# Patient Record
Sex: Female | Born: 1935 | Race: White | Hispanic: No | Smoking: Never smoker
Health system: Southern US, Community
[De-identification: ages and names within clinical notes are randomized; demographics above are authoritative.]

## PROBLEM LIST (undated history)

## (undated) DIAGNOSIS — F0280 Dementia in other diseases classified elsewhere without behavioral disturbance: Secondary | ICD-10-CM

## (undated) DIAGNOSIS — E079 Disorder of thyroid, unspecified: Secondary | ICD-10-CM

## (undated) DIAGNOSIS — G309 Alzheimer's disease, unspecified: Secondary | ICD-10-CM

## (undated) HISTORY — PX: KNEE SURGERY: SHX244

---

## 1998-10-19 ENCOUNTER — Ambulatory Visit (HOSPITAL_COMMUNITY): Admission: RE | Admit: 1998-10-19 | Discharge: 1998-10-19 | Payer: Self-pay | Admitting: Gastroenterology

## 1998-10-19 ENCOUNTER — Encounter: Payer: Self-pay | Admitting: Gastroenterology

## 1998-10-22 ENCOUNTER — Encounter: Payer: Self-pay | Admitting: Gastroenterology

## 1998-10-22 ENCOUNTER — Ambulatory Visit (HOSPITAL_COMMUNITY): Admission: RE | Admit: 1998-10-22 | Discharge: 1998-10-22 | Payer: Self-pay | Admitting: Gastroenterology

## 2001-04-11 ENCOUNTER — Encounter: Payer: Self-pay | Admitting: Emergency Medicine

## 2001-04-11 ENCOUNTER — Emergency Department (HOSPITAL_COMMUNITY): Admission: EM | Admit: 2001-04-11 | Discharge: 2001-04-11 | Payer: Self-pay | Admitting: Emergency Medicine

## 2007-10-13 ENCOUNTER — Emergency Department (HOSPITAL_COMMUNITY): Admission: EM | Admit: 2007-10-13 | Discharge: 2007-10-13 | Payer: Self-pay | Admitting: Emergency Medicine

## 2011-02-25 NOTE — Consult Note (Signed)
NAMEALEYDA, GINDLESPERGER                  ACCOUNT NO.:  [id number]   MEDICAL RECORD NO.:  [id number]          PATIENT TYPE:  EMS   LOCATION:  ED                           FACILITY:  [facility]   PHYSICIAN:  Lorne Skeens. Hoxworth, M.D.DATE OF BIRTH:  1917/03/15   DATE OF CONSULTATION:  10/13/2007  DATE OF DISCHARGE:  10/13/2007                                 CONSULTATION   CHIEF COMPLAINT:  Rectal discomfort and bleeding.   HISTORY OF PRESENT ILLNESS:  Ms. Meda is an 77 year old white female  history of Alzheimer's disease who presents to the [facility] emergency  room.  I was asked by Dr. Deretha Emory to evaluate the patient.  She lives  at home with close assistance from her son and daughter-in-law.  Today  the patient told her son she was having some rectal discomfort.  No  severe pain.  He examined her and found that she had a lot of tissue  protruding from her rectum with some bleeding.  They were seen at local  Urgent [facility] and had a rectal prolapse reduced and were told to come to  the emergency room if it recurred.  Later today her symptoms recurred  and she presented to the emergency room with her family.  She states it  has been like this for couple of days, but she does have dementia and  is unable to give a very accurate history.  Her son who cares for her  quite a bit is not aware of any problems prior to this episode.  She has  not apparently had any abdominal pain, nausea, vomiting, diarrhea or  constipation..  She denies any pain or other complaints currently.  Denies any previous colorectal problems or abdominal surgery.   PAST MEDICAL HISTORY:  Surgery:  Apparently none other than knee  replacement.  Medically she is followed for Alzheimer's, diabetes,  reflux, hypothyroidism and GERD.   MEDICATIONS:  1. Aricept 10 mg a day.  2. Glimepiride 2 mg daily.  3. Synthroid 112 mcg daily.  4. Prevacid 30 mg daily.   ALLERGIES:  None.   SOCIAL HISTORY:  She is widowed, lives alone  with assistance from her  son.  No cigarettes or alcohol.   FAMILY HISTORY:  Noncontributory.   REVIEW OF SYSTEMS:  GENERAL:  No weight change, fever, chills.  RESPIRATORY:  Some occasional shortness of breath, wheezing, minimal.  CARDIAC:  Denies chest pain, palpitations, swelling.  ABDOMEN:  GI  negative as above.  GU:  No urinary difficulties. MUSCULOSKELETAL:  Some  occasional joint pain. NEUROLOGIC:  She has significant confusion at  times.   PHYSICAL EXAMINATION:  VITAL SIGNS:  She is afebrile, vital signs all  within normal limits.  GENERAL:  She is alert, pleasant, but obviously mildly confused white  female.  SKIN:  Warm and dry.  HEENT:  No palpable mass or thyromegaly.  Sclera nonicteric.  LUNGS:  Clear without wheezing or increased work of breathing.  CARDIAC:  Regular rate and rhythm.  No murmurs.  No JVD or edema.  ABDOMEN:  Flat, soft, nontender.  No  palpable masses or organomegaly.  RECTAL:  There is about a 5 cm apparent full-thickness rectal prolapse.  The mucosa is edematous and slightly dusky with some slight bleeding.  This however is easily reduced.  After reduction there is markedly  reduced anal sphincter tone.  No palpable rectal masses.  EXTREMITIES:  No joint swelling deformity.  NEUROLOGIC:  She is oriented to person and somewhat to situation only.  Motor and sensory exams grossly normal.   ASSESSMENT/PLAN:  Rectal prolapse recent onset.  There is some edema  congestion, but no necrosis and is easily reduced.  She will likely need  a procedure for this, but this is non-emergent.  She will need  colorectal referral.  I have  discussed this with the family.  I believe  at this point she can be managed as an outpatient.  She is instructed to  stay off her feet as much as possible and the family is instructed how  to mainly reduce her prolapse should it recur.  I will then make  outpatient referral to a nearby of colorectal practice for evaluation.   Understand that they can call me if this is not manageable or if she is  having increasing symptoms at home.      Lorne Skeens. Hoxworth, M.D.  Electronically Signed     BTH/MEDQ  D:  10/13/2007  T:  10/14/2007  Job:  [id number]

## 2011-11-25 ENCOUNTER — Emergency Department (HOSPITAL_COMMUNITY): Payer: Medicare Other

## 2011-11-25 ENCOUNTER — Encounter (HOSPITAL_COMMUNITY): Payer: Self-pay | Admitting: Emergency Medicine

## 2011-11-25 ENCOUNTER — Emergency Department (HOSPITAL_COMMUNITY)
Admission: EM | Admit: 2011-11-25 | Discharge: 2011-11-25 | Disposition: A | Discharge status: Home / Self Care | Payer: Medicare Other | Attending: Emergency Medicine | Admitting: Emergency Medicine

## 2011-11-25 ENCOUNTER — Other Ambulatory Visit: Payer: Self-pay

## 2011-11-25 DIAGNOSIS — Q438 Other specified congenital malformations of intestine: Secondary | ICD-10-CM | POA: Insufficient documentation

## 2011-11-25 DIAGNOSIS — Y92009 Unspecified place in unspecified non-institutional (private) residence as the place of occurrence of the external cause: Secondary | ICD-10-CM | POA: Insufficient documentation

## 2011-11-25 DIAGNOSIS — W06XXXA Fall from bed, initial encounter: Secondary | ICD-10-CM | POA: Insufficient documentation

## 2011-11-25 DIAGNOSIS — N390 Urinary tract infection, site not specified: Secondary | ICD-10-CM | POA: Insufficient documentation

## 2011-11-25 DIAGNOSIS — E119 Type 2 diabetes mellitus without complications: Secondary | ICD-10-CM | POA: Insufficient documentation

## 2011-11-25 DIAGNOSIS — G309 Alzheimer's disease, unspecified: Secondary | ICD-10-CM | POA: Insufficient documentation

## 2011-11-25 DIAGNOSIS — M79601 Pain in right arm: Secondary | ICD-10-CM

## 2011-11-25 DIAGNOSIS — E079 Disorder of thyroid, unspecified: Secondary | ICD-10-CM | POA: Insufficient documentation

## 2011-11-25 DIAGNOSIS — Z79899 Other long term (current) drug therapy: Secondary | ICD-10-CM | POA: Insufficient documentation

## 2011-11-25 DIAGNOSIS — M79609 Pain in unspecified limb: Secondary | ICD-10-CM | POA: Insufficient documentation

## 2011-11-25 DIAGNOSIS — W19XXXA Unspecified fall, initial encounter: Secondary | ICD-10-CM

## 2011-11-25 DIAGNOSIS — F0280 Dementia in other diseases classified elsewhere without behavioral disturbance: Secondary | ICD-10-CM

## 2011-11-25 HISTORY — DX: Alzheimer's disease, unspecified: G30.9

## 2011-11-25 HISTORY — DX: Dementia in other diseases classified elsewhere, unspecified severity, without behavioral disturbance, psychotic disturbance, mood disturbance, and anxiety: F02.80

## 2011-11-25 HISTORY — DX: Disorder of thyroid, unspecified: E07.9

## 2011-11-25 LAB — URINALYSIS, ROUTINE W REFLEX MICROSCOPIC
Bilirubin Urine: NEGATIVE
Glucose, UA: >1000 mg/dL — AB
Ketones, ur: NEGATIVE mg/dL
Nitrite: POSITIVE — AB
Protein, ur: NEGATIVE mg/dL
Specific Gravity, Urine: 1.03 (ref 1.005–1.030)
Urobilinogen, UA: 0.2 mg/dL (ref 0.0–1.0)
pH: 6 (ref 5.0–8.0)

## 2011-11-25 LAB — URINE MICROSCOPIC-ADD ON

## 2011-11-25 LAB — POCT I-STAT, CHEM 8
HCT: 42 % (ref 36.0–46.0)
Hemoglobin: 14.3 g/dL (ref 12.0–15.0)
Potassium: 4.2 mEq/L (ref 3.5–5.1)
Sodium: 140 mEq/L (ref 135–145)

## 2011-11-25 MED ORDER — ACETAMINOPHEN 325 MG PO TABS
650.0000 mg | ORAL_TABLET | Freq: Once | ORAL | Status: AC
  Administered 2011-11-25: 650 mg via ORAL
  Filled 2011-11-25: qty 2

## 2011-11-25 MED ORDER — SULFAMETHOXAZOLE-TRIMETHOPRIM 400-80 MG PO TABS: 1.0000 | ORAL_TABLET | Freq: Two times a day (BID) | ORAL | Status: AC

## 2011-11-25 MED ORDER — SULFAMETHOXAZOLE-TMP DS 800-160 MG PO TABS
1.0000 | ORAL_TABLET | Freq: Once | ORAL | Status: AC
  Administered 2011-11-25: 1 via ORAL
  Filled 2011-11-25: qty 1

## 2011-11-25 NOTE — Discharge Instructions (Signed)
Alzheimer's Disease Alzheimer's disease is a breaking down of the brain that sometimes happens with aging. It often affects memory, thinking, and speaking. It also affects how you get along with people and job performance. Often the changes come on slowly and are not very noticeable. The changes may be silent and hidden for a long time, even from family and friends. Mood and personality changes often cause the family to seek medical care. CAUSES  Alzheimer's disease is the leading cause of dementia. Dementia is a reduced ability of the working of the brain. Alzheimer's is one type of dementia and is caused by small changes in the brain. There are many causes of Alzheimer's disease. DIAGNOSIS  There are no specific tests for Alzheimer's disease, other than doing a biopsy(tissue sample) of the brain. This is not usually done. Physical examination and history often provide the best clues for a diagnosis. Often the diagnosis may be made over time, with more observation.  TREATMENT  There is no specific treatment for Alzheimer's disease. Your caregivers will discuss the particular problems you are dealing with or may deal with. They can help direct you to other caregivers who can help in the care of Alzheimer's patients.  Document Released: 07/09/2005 Document Revised: 06/11/2011 Document Reviewed: 03/01/2007 [facility] Patient Information 2012 [place], [place].   Home Safety and Preventing Falls Falls are a leading cause of injury and while they affect all age groups, falls have greater short-term and long-term impact on older age groups. However, falls should not be a part of life or aging. It is possible for individuals and their families to use preventive measures to significantly decrease the likelihood that anyone, especially an older adult, will fall. There are many simple measures which can make your home safer with respect to preventing falls. The following actions can help reduce falls among all  members of your family and are especially important as you age, when your balance, lower limb strength, coordination, and eyesight may be declining. The use of preventive measures will help to reduce you and your family's risk of falls and serious medical consequences. OUTDOORS  Repair cracks and edges of walkways and driveways.   Remove high doorway thresholds and trim shrubbery on the main path into your home.   Ensure there is good outside lighting at main entrances and along main walkways.   Clear walkways of tools, rocks, debris, and clutter.   Check that handrails are not broken and are securely fastened. Both sides of steps should have handrails.   In the garage, be attentive to and clean up grease or oil spills on the cement. This can make the surface extremely slippery.   In winter, have leaves, snow, and ice cleared regularly.   Use sand or salt on walkways during winter months.  BATHROOM  Install grab bars by the toilet and in the tub and shower.   Use non-skid mats or decals in the tub or shower.   If unable to easily stand unsupported while showering, place a plastic non slip stool in the shower to sit on when needed.   Install night lights.   Keep floors dry and clean up all water on the floor immediately.   Remove soap buildup in tub or shower on a regular basis.   Secure bath mats with non-slip, double-sided rug tape.   Remove tripping hazards from the floors.  BEDROOMS  Install night lights.   Do not use oversized bedding.   Make sure a bedside light is easy  to reach.   Keep a telephone by your bedside.   Make sure that you can get in and out of your bed easily.   Have a firm chair, with side arms, to use for getting dressed.   Remove clutter from around closets.   Store clothing, bed coverings, and other household items where you can reach them comfortably.   Remove tripping hazards from the floor.  LIVING AREAS AND STAIRWAYS  Turn on lights  to avoid having to walk through dark areas.   Keep lighting uniform in each room. Place brighter lightbulbs in darker areas, including stairways.   Replace lightbulbs that burn out in stairways immediately.   Arrange furniture to provide for clear pathways.   Keep furniture in the same place.   Eliminate or tape down electrical cables in high traffic areas.   Place handrails on both sides of stairways. Use handrails when going up or down stairs.   Most falls occur on the top or bottom 3 steps.   Fix any loose handrails. Make sure handrails on both sides of the stairways are as long as the stairs.   Remove all walkway obstacles.   Coil or tape electrical cords off to the side of walking areas and out of the way. If using many extension cords, have an electrician put in a new wall outlet to reduce or eliminate them.   Make sure spills are cleaned up quickly and allow time for drying before walking on freshly cleaned floors.   Firmly attach carpet with non-skid or two-sided tape.   Keep frequently used items within easy reach.   Remove tripping hazards such as throw rugs and clutter in walkways. Never leave objects on stairs.   Get rid of throw rugs elsewhere if possible.   Eliminate uneven floor surfaces.   Make sure couches and chairs are easy to get into and out of.   Check carpeting to make sure it is firmly attached along stairs.   Make repairs to worn or loose carpet promptly.   Select a carpet pattern that does not visually hide the edge of steps.   Avoid placing throw rugs or scatter rugs at the top or bottom of stairways, or properly secure with carpet tape to prevent slippage.   Have an electrician put in a light switch at the top and bottom of the stairs.   Get light switches that glow.   Avoid the following practices: hurrying, inattention, obscured vision, carrying large loads, and wearing slip-on shoes.   Be aware of all pets.  KITCHEN  Place items that  are used frequently, such as dishes and food, within easy reach.   Keep handles on pots and pans toward the center of the stove. Use back burners when possible.   Make sure spills are cleaned up quickly and allow time for drying.   Avoid walking on wet floors.   Avoid hot utensils and knives.   Position shelves so they are not too high or low.   Place commonly used objects within easy reach.   If necessary, use a sturdy step stool with a grab bar when reaching.   Make sure electrical cables are out of the way.   Do not use floor polish or wax that makes floors slippery.  OTHER HOME FALL PREVENTION STRATEGIES  Wear low heel or rubber sole shoes that are supportive and fit well.   Wear closed toe shoes.   Know and watch for side effects of medications. Have your  caregiver or pharmacist look at all your medicines, even over-the-counter medicines. Some medicines can make you sleepy or dizzy.   Exercise regularly. Exercise makes you stronger and improves your balance and coordination.   Limit use of alcohol.   Use eyeglasses if necessary and keep them clean. Have your vision checked every year.   Organize your household in a manner that minimizes the need to walk distances when hurried, or go up and down stairs unnecessarily. For example, have a phone placed on at least each floor of your home. If possible, have a phone beside each sitting or lying area where you spend the most time at home. Keep emergency numbers posted at all phones.   Use non-skid floor wax.   When using a ladder, make sure:   The base is firm.   All ladder feet are on level ground.   The ladder is angled against the wall properly.   When climbing a ladder, face the ladder and hold the ladder rungs firmly.   If reaching, always keep your hips and body weight centered between the rails.   When using a stepladder, make sure it is fully opened and both spreaders are firmly locked.   Do not climb a closed  stepladder.   Avoid climbing beyond the second step from the top of a stepladder and the 4th rung from the top of an extension ladder.   Learn and use mobility aids as needed.   Change positions slowly. Arise slowly from sitting and lying positions. Sit on the edge of your bed before getting to your feet.   If you have a history of falls, ask someone to add color or contrast paint or tape to grab bars and handrails in your home.   If you have a history of falls, ask someone to place contrasting color strips on first and last steps.   Install an electrical emergency response system if you need one, and know how to use it.   If you have a medical or other condition that causes you to have limited physical strength, it is important that you reach out to family and friends for occasional help.  FOR CHILDREN:  If young children are in the home, use safety gates. At the top of stairs use screw-mounted gates; use pressure-mounted gates for the bottom of the stairs and doorways between rooms.   Young children should be taught to descend stairs on their stomachs, feet first, and later using the handrail.   Keep drawers fully closed to prevent them from being climbed on or pulled out entirely.   Move chairs, cribs, beds and other furniture away from windows.   Consider installing window guards on windows ground floor and up, unless they are emergency fire exits. Make sure they have easy release mechanisms.   Consider installing special locks that only allow the window to be opened to a certain height.   Never rely on window screens to prevent falls.   Never leave babies alone on changing tables, beds or sofas. Use a changing table that has a restraining strap.   When a child can pull to a standing position, the crib mattress should be adjusted to its lowest position. There should be at least 26 inches between the top rails of the crib drop side and the mattress. Toys, bumper pads, and other  objects that can be used as steps to climb out should be removed from the crib.   On bunk beds never allow a child  under age 63 to sleep on the top bunk. For older children, if the upper bunk is not against a wall, use guard rails on both sides. No matter how old a child is, keep the guard rails in place on the top bunk since children roll during sleep. Do not permit horseplay on bunks.   Grass and soil surfaces beneath backyard playground equipment should be replaced with hardwood chips, shredded wood mulch, sand, pea gravel, rubber, crushed stone, or another safer material at depths of at least 9 to 12 inches.   When riding bikes or using skates, skateboards, skis, or snowboards, require children to wear helmets. Look for those that have stickers stating that they meet or exceed safety standards.   Vertical posts or pickets in deck, balcony, and stairway railings should be no more than 3 1/2 inches apart if a young baby will have access to the area. The space between horizontal rails or bars, and between the floor and the first horizontal rail or bar, should be no more than 3 1/2 inches.  Document Released: 09/19/2002 Document Revised: 06/11/2011 Document Reviewed: 07/19/2009 [facility] Patient Information 2012 [place], [place].   Contusion A bruise (contusion) or hematoma is a collection of blood under skin causing an area of discoloration. It is caused by an injury to blood vessels beneath the injured area with a release of blood into that area. As blood accumulates it is known as a hematoma. This collection of blood causes a blue to dark blue color. As the injury improves over days to weeks it turns to a yellowish color and then usually disappears completely over the same period of time. These generally resolve completely without problems. The hematoma rarely requires drainage. HOME CARE INSTRUCTIONS   Apply ice to the injured area for 15 to 20 minutes 3 to 4 times per day for the first 1 or 2  days.   Put the ice in a plastic bag and place a towel between the bag of ice and your skin. Discontinue the ice if it causes pain.   If bleeding is more than just a little, apply pressure to the area for at least thirty minutes to decrease the amount of bruising. Apply pressure and ice as your caregiver suggests.   If the injury is on an extremity, elevation of that part may help to decrease pain and swelling. Wrapping with an ace or supportive wrap may also be helpful. If the bruise is on a lower extremity and is painful, crutches may be helpful for a couple days.   If you have been given a tetanus shot because the skin was broken, your arm may get swollen, red and warm to touch at the shot site. This is a normal response to the medicine in the shot. If you did not receive a tetanus shot today because you did not recall when your last one was given, make sure to check with your caregiver's office and determine if one is needed. Generally for a "dirty" wound, you should receive a tetanus booster if you have not had one in the last five years. If you have a "clean" wound, you should receive a tetanus booster if you have not had one within the last ten years.  SEEK MEDICAL CARE IF:   You have pain not controlled with over the counter medications. Only take over-the-counter or prescription medicines for pain, discomfort, or fever as directed by your caregiver. Do not use aspirin as it may cause bleeding.   You  develop increasing pain or swelling in the area of injury.   You develop any problems which seem worse than the problems which brought you in.  SEEK IMMEDIATE MEDICAL CARE IF:   You have a fever.   You develop severe pain in the area of the bruise out of proportion to the initial injury.   The bruised area becomes red, tender, and swollen.  MAKE SURE YOU:   Understand these instructions.   Will watch your condition.   Will get help right away if you are not doing well or get worse.    Document Released: 07/09/2005 Document Revised: 06/11/2011 Document Reviewed: 05/17/2008 [facility] Patient Information 2012 [place], [place].

## 2011-11-25 NOTE — ED Notes (Signed)
Brought to ED by son with c/o right arm pain. Lives with son, has hx of alzheimer's, was found on floor next to bed this am. No bruising noted, has some mobility in arm, hurts to move at shoulder.

## 2011-11-25 NOTE — ED Notes (Signed)
EKG shown to Dr. Patrica Duel

## 2011-11-25 NOTE — ED Provider Notes (Signed)
History     CSN: [id number]  Arrival date & time 11/25/11  1191   First MD Initiated Contact with Patient 11/25/11 [phone]      Chief Complaint  Patient presents with  . right arm pain    patient with a known history of Alzheimer's dementia, lives at home with son, apparently, fell from bed in the middle of the night and was found in the floor. Next to the bed. She complains of pain in her proximal right arm and humerus area. She denies any pain or injury to the head or neck. The son states that she has been at her baseline. No increasing confusion. No recent illness. No vomiting or nausea. Denies any other symptoms such as chest pain, abdominal pain or back pain.  (Consider location/radiation/quality/duration/timing/severity/associated sxs/prior treatment) HPI  Past Medical History  Diagnosis Date  . Alzheimer's dementia   . Diabetes mellitus   . Thyroid disease     Past Surgical History  Procedure Date  . Knee surgery     No family history on file.  History  Substance Use Topics  . Smoking status: Not on file  . Smokeless tobacco: Not on file  . Alcohol Use: No    OB History    Grav Para Term Preterm Abortions TAB SAB Ect Mult Living                  Review of Systems  Unable to perform ROS   Allergies  Review of patient's allergies indicates no known allergies.  Home Medications   Current Outpatient Rx  Name Route Sig Dispense Refill  . DONEPEZIL HCL 10 MG PO TABS Oral Take 10 mg by mouth daily.    . OMEGA-3 FATTY ACIDS 1000 MG PO CAPS Oral Take 1 g by mouth daily.    Marland Kitchen GINKGO BILOBA 100 MG PO CAPS Oral Take 100 mg by mouth daily.    Marland Kitchen GLIMEPIRIDE 2 MG PO TABS Oral Take 2 mg by mouth daily before breakfast.    . LANSOPRAZOLE 30 MG PO CPDR Oral Take 30 mg by mouth daily.    Marland Kitchen LEVOTHYROXINE SODIUM 112 MCG PO TABS Oral Take 112 mcg by mouth daily.    . ADULT MULTIVITAMIN W/MINERALS CH Oral Take 1 tablet by mouth daily.      BP 148/72  Pulse 110   Temp(Src) 98.5 F (36.9 C) (Oral)  Resp 18  SpO2 100%  Physical Exam  Nursing note and vitals reviewed. Constitutional: She appears well-developed and well-nourished.  HENT:  Head: Normocephalic and atraumatic.  Eyes: Conjunctivae and EOM are normal. Pupils are equal, round, and reactive to light.  Neck: Neck supple.  Cardiovascular: Normal rate and regular rhythm.  Exam reveals no gallop and no friction rub.   No murmur heard. Pulmonary/Chest: Breath sounds normal. She has no wheezes. She has no rales. She exhibits no tenderness.  Abdominal: Soft. Bowel sounds are normal. She exhibits no distension. There is no tenderness. There is no rebound and no guarding.  Musculoskeletal: Normal range of motion. She exhibits tenderness. She exhibits no edema.       Tenderness to the proximal right humerus, no redness or swelling. Distal pulses are normal. No deformity  Neurological: She is alert. No cranial nerve deficit. Coordination normal.       Awake alert, baseline dementia. No focal deficits  Skin: Skin is warm and dry. No rash noted.  Psychiatric: She has a normal mood and affect.    ED Course  Procedures (including critical care time)  Labs Reviewed  URINALYSIS, ROUTINE W REFLEX MICROSCOPIC - Abnormal; Notable for the following:    APPearance CLOUDY (*)    Glucose, UA >1000 (*)    Hgb urine dipstick TRACE (*)    Nitrite POSITIVE (*)    Leukocytes, UA MODERATE (*)    All other components within normal limits  POCT I-STAT, CHEM 8 - Abnormal; Notable for the following:    Glucose, Bld 294 (*)    All other components within normal limits  URINE MICROSCOPIC-ADD ON - Abnormal; Notable for the following:    Squamous Epithelial / LPF FEW (*)    Bacteria, UA MANY (*)    All other components within normal limits   Dg Humerus Right  11/25/2011  *RADIOLOGY REPORT*  Clinical Data: 78 year old female status post fall with right humerus pain.  RIGHT HUMERUS - 2+ VIEW  Comparison: None.   Findings: Visualized right ribs and lung parenchyma are within normal limits.  Degenerative changes at the right acromioclavicular joint.  Visualized right clavicle and scapula grossly intact. Grossly normal alignment at the right elbow.  Bicipital groove incidentally prominent on the first view.  No right humerus fracture identified.  IMPRESSION: No acute fracture or dislocation identified about the right humerus.  Original Report Authenticated By: Harley Hallmark, M.D.     No diagnosis found.    MDM  Pt is seen and examined;  Initial history and physical completed.  Will follow.        Results for orders placed during the hospital encounter of 11/25/11  URINALYSIS, ROUTINE W REFLEX MICROSCOPIC      Component Value Range   Color, Urine YELLOW  YELLOW    APPearance CLOUDY (*) CLEAR    Specific Gravity, Urine 1.030  1.005 - 1.030    pH 6.0  5.0 - 8.0    Glucose, UA >1000 (*) NEGATIVE (mg/dL)   Hgb urine dipstick TRACE (*) NEGATIVE    Bilirubin Urine NEGATIVE  NEGATIVE    Ketones, ur NEGATIVE  NEGATIVE (mg/dL)   Protein, ur NEGATIVE  NEGATIVE (mg/dL)   Urobilinogen, UA 0.2  0.0 - 1.0 (mg/dL)   Nitrite POSITIVE (*) NEGATIVE    Leukocytes, UA MODERATE (*) NEGATIVE   POCT I-STAT, CHEM 8      Component Value Range   Sodium 140  135 - 145 (mEq/L)   Potassium 4.2  3.5 - 5.1 (mEq/L)   Chloride 104  96 - 112 (mEq/L)   BUN 18  6 - 23 (mg/dL)   Creatinine, Ser 1.61  0.50 - 1.10 (mg/dL)   Glucose, Bld 096 (*) 70 - 99 (mg/dL)   Calcium, Ion 0.45  4.09 - 1.32 (mmol/L)   TCO2 24  0 - 100 (mmol/L)   Hemoglobin 14.3  12.0 - 15.0 (g/dL)   HCT 81.1  91.4 - 78.2 (%)  URINE MICROSCOPIC-ADD ON      Component Value Range   Squamous Epithelial / LPF FEW (*) RARE    WBC, UA TOO NUMEROUS TO COUNT  <3 (WBC/hpf)   RBC / HPF 3-6  <3 (RBC/hpf)   Bacteria, UA MANY (*) RARE    Dg Humerus Right  11/25/2011  *RADIOLOGY REPORT*  Clinical Data: 78 year old female status post fall with right humerus  pain.  RIGHT HUMERUS - 2+ VIEW  Comparison: None.  Findings: Visualized right ribs and lung parenchyma are within normal limits.  Degenerative changes at the right acromioclavicular joint.  Visualized right clavicle and scapula  grossly intact. Grossly normal alignment at the right elbow.  Bicipital groove incidentally prominent on the first view.  No right humerus fracture identified.  IMPRESSION: No acute fracture or dislocation identified about the right humerus.  Original Report Authenticated By: Harley Hallmark, M.D.     Date: 11/25/2011  Rate: 78  Rhythm: normal sinus rhythm  QRS Axis: normal  Intervals: normal  ST/T Wave abnormalities: normal  Conduction Disutrbances:none  Narrative Interpretation:   Old EKG Reviewed: none available    Labs, UA discussed  with family. Will be stable for discharge. No acute fractures    Shenoa Hattabaugh A. Patrica Duel, MD 11/25/11 1113

## 2017-03-29 DIAGNOSIS — J181 Lobar pneumonia, unspecified organism: Secondary | ICD-10-CM | POA: Diagnosis not present

## 2017-03-29 DIAGNOSIS — Z789 Other specified health status: Secondary | ICD-10-CM

## 2017-03-29 DIAGNOSIS — N390 Urinary tract infection, site not specified: Secondary | ICD-10-CM | POA: Diagnosis not present

## 2017-03-29 DIAGNOSIS — R410 Disorientation, unspecified: Secondary | ICD-10-CM | POA: Diagnosis not present

## 2017-03-30 DIAGNOSIS — J181 Lobar pneumonia, unspecified organism: Secondary | ICD-10-CM | POA: Diagnosis not present

## 2017-03-30 DIAGNOSIS — N390 Urinary tract infection, site not specified: Secondary | ICD-10-CM | POA: Diagnosis not present

## 2017-03-30 DIAGNOSIS — R410 Disorientation, unspecified: Secondary | ICD-10-CM | POA: Diagnosis not present

## 2017-03-30 DIAGNOSIS — Z789 Other specified health status: Secondary | ICD-10-CM | POA: Diagnosis not present

## 2017-04-16 ENCOUNTER — Emergency Department (HOSPITAL_COMMUNITY): Payer: Medicare Other

## 2017-04-16 ENCOUNTER — Encounter (HOSPITAL_COMMUNITY): Payer: Self-pay | Admitting: Emergency Medicine

## 2017-04-16 ENCOUNTER — Inpatient Hospital Stay (HOSPITAL_COMMUNITY)
Admission: EM | Admit: 2017-04-16 | Discharge: 2017-04-21 | DRG: 481 | Disposition: A | Discharge status: Skilled Nursing Facility | Payer: Medicare Other | Attending: Internal Medicine | Admitting: Internal Medicine

## 2017-04-16 DIAGNOSIS — Z9889 Other specified postprocedural states: Secondary | ICD-10-CM

## 2017-04-16 DIAGNOSIS — S72141A Displaced intertrochanteric fracture of right femur, initial encounter for closed fracture: Principal | ICD-10-CM | POA: Diagnosis present

## 2017-04-16 DIAGNOSIS — E089 Diabetes mellitus due to underlying condition without complications: Secondary | ICD-10-CM

## 2017-04-16 DIAGNOSIS — W19XXXA Unspecified fall, initial encounter: Secondary | ICD-10-CM | POA: Diagnosis present

## 2017-04-16 DIAGNOSIS — G309 Alzheimer's disease, unspecified: Secondary | ICD-10-CM | POA: Diagnosis present

## 2017-04-16 DIAGNOSIS — F0280 Dementia in other diseases classified elsewhere without behavioral disturbance: Secondary | ICD-10-CM | POA: Diagnosis present

## 2017-04-16 DIAGNOSIS — D62 Acute posthemorrhagic anemia: Secondary | ICD-10-CM | POA: Diagnosis not present

## 2017-04-16 DIAGNOSIS — Z8781 Personal history of (healed) traumatic fracture: Secondary | ICD-10-CM

## 2017-04-16 DIAGNOSIS — Z09 Encounter for follow-up examination after completed treatment for conditions other than malignant neoplasm: Secondary | ICD-10-CM

## 2017-04-16 DIAGNOSIS — R451 Restlessness and agitation: Secondary | ICD-10-CM | POA: Diagnosis present

## 2017-04-16 DIAGNOSIS — Z79899 Other long term (current) drug therapy: Secondary | ICD-10-CM

## 2017-04-16 DIAGNOSIS — Y92129 Unspecified place in nursing home as the place of occurrence of the external cause: Secondary | ICD-10-CM

## 2017-04-16 DIAGNOSIS — E119 Type 2 diabetes mellitus without complications: Secondary | ICD-10-CM

## 2017-04-16 DIAGNOSIS — Z66 Do not resuscitate: Secondary | ICD-10-CM | POA: Diagnosis present

## 2017-04-16 DIAGNOSIS — T148XXA Other injury of unspecified body region, initial encounter: Secondary | ICD-10-CM | POA: Diagnosis present

## 2017-04-16 NOTE — ED Triage Notes (Signed)
Per EMS Pt fell on right hip. No bruising or deformity noted.

## 2017-04-17 ENCOUNTER — Inpatient Hospital Stay (HOSPITAL_COMMUNITY): Payer: Medicare Other

## 2017-04-17 ENCOUNTER — Inpatient Hospital Stay (HOSPITAL_COMMUNITY): Payer: Medicare Other | Admitting: Certified Registered Nurse Anesthetist

## 2017-04-17 ENCOUNTER — Encounter (HOSPITAL_COMMUNITY): Payer: Self-pay | Admitting: Family Medicine

## 2017-04-17 ENCOUNTER — Encounter (HOSPITAL_COMMUNITY)
Admission: EM | Disposition: A | Discharge status: Skilled Nursing Facility | Payer: Self-pay | Source: Home / Self Care | Attending: Internal Medicine

## 2017-04-17 DIAGNOSIS — Y92129 Unspecified place in nursing home as the place of occurrence of the external cause: Secondary | ICD-10-CM | POA: Diagnosis not present

## 2017-04-17 DIAGNOSIS — E089 Diabetes mellitus due to underlying condition without complications: Secondary | ICD-10-CM

## 2017-04-17 DIAGNOSIS — Z9889 Other specified postprocedural states: Secondary | ICD-10-CM | POA: Diagnosis not present

## 2017-04-17 DIAGNOSIS — F0280 Dementia in other diseases classified elsewhere without behavioral disturbance: Secondary | ICD-10-CM | POA: Diagnosis present

## 2017-04-17 DIAGNOSIS — Z79899 Other long term (current) drug therapy: Secondary | ICD-10-CM | POA: Diagnosis not present

## 2017-04-17 DIAGNOSIS — R451 Restlessness and agitation: Secondary | ICD-10-CM | POA: Diagnosis present

## 2017-04-17 DIAGNOSIS — G309 Alzheimer's disease, unspecified: Secondary | ICD-10-CM | POA: Diagnosis present

## 2017-04-17 DIAGNOSIS — E119 Type 2 diabetes mellitus without complications: Secondary | ICD-10-CM | POA: Diagnosis present

## 2017-04-17 DIAGNOSIS — Z66 Do not resuscitate: Secondary | ICD-10-CM | POA: Diagnosis present

## 2017-04-17 DIAGNOSIS — D62 Acute posthemorrhagic anemia: Secondary | ICD-10-CM | POA: Diagnosis not present

## 2017-04-17 DIAGNOSIS — T148XXA Other injury of unspecified body region, initial encounter: Secondary | ICD-10-CM | POA: Diagnosis present

## 2017-04-17 DIAGNOSIS — W19XXXA Unspecified fall, initial encounter: Secondary | ICD-10-CM | POA: Diagnosis not present

## 2017-04-17 DIAGNOSIS — S72141A Displaced intertrochanteric fracture of right femur, initial encounter for closed fracture: Secondary | ICD-10-CM | POA: Diagnosis present

## 2017-04-17 HISTORY — PX: INTRAMEDULLARY (IM) NAIL INTERTROCHANTERIC: SHX5875

## 2017-04-17 HISTORY — PX: FEMUR FRACTURE SURGERY: SHX633

## 2017-04-17 LAB — CBC WITH DIFFERENTIAL/PLATELET
BASOS PCT: 0 %
Basophils Absolute: 0 10*3/uL (ref 0.0–0.1)
Eosinophils Absolute: 0.1 10*3/uL (ref 0.0–0.7)
Eosinophils Relative: 1 %
HEMATOCRIT: 26.5 % — AB (ref 36.0–46.0)
HEMOGLOBIN: 8.5 g/dL — AB (ref 12.0–15.0)
LYMPHS PCT: 24 %
Lymphs Abs: 3.1 10*3/uL (ref 0.7–4.0)
MCH: 26 pg (ref 26.0–34.0)
MCHC: 32.1 g/dL (ref 30.0–36.0)
MCV: 81 fL (ref 78.0–100.0)
MONOS PCT: 9 %
Monocytes Absolute: 1.2 10*3/uL — ABNORMAL HIGH (ref 0.1–1.0)
NEUTROS ABS: 8.6 10*3/uL — AB (ref 1.7–7.7)
NEUTROS PCT: 66 %
Platelets: 366 10*3/uL (ref 150–400)
RBC: 3.27 MIL/uL — ABNORMAL LOW (ref 3.87–5.11)
RDW: 16.1 % — ABNORMAL HIGH (ref 11.5–15.5)
WBC: 13 10*3/uL — ABNORMAL HIGH (ref 4.0–10.5)

## 2017-04-17 LAB — COMPREHENSIVE METABOLIC PANEL
ALBUMIN: 3.4 g/dL — AB (ref 3.5–5.0)
ALK PHOS: 148 U/L — AB (ref 38–126)
ALT: 15 U/L (ref 14–54)
ANION GAP: 12 (ref 5–15)
AST: 19 U/L (ref 15–41)
BILIRUBIN TOTAL: 0.6 mg/dL (ref 0.3–1.2)
BUN: 14 mg/dL (ref 6–20)
CALCIUM: 8.8 mg/dL — AB (ref 8.9–10.3)
CO2: 23 mmol/L (ref 22–32)
Chloride: 106 mmol/L (ref 101–111)
Creatinine, Ser: 1.01 mg/dL — ABNORMAL HIGH (ref 0.44–1.00)
GFR calc Af Amer: 52 mL/min — ABNORMAL LOW (ref >60)
GFR calc non Af Amer: 45 mL/min — ABNORMAL LOW (ref >60)
GLUCOSE: 69 mg/dL (ref 65–99)
Potassium: 3.5 mmol/L (ref 3.5–5.1)
Sodium: 141 mmol/L (ref 135–145)
TOTAL PROTEIN: 6.6 g/dL (ref 6.5–8.1)

## 2017-04-17 LAB — PROTIME-INR
INR: 1.08
PROTHROMBIN TIME: 14.1 s (ref 11.4–15.2)

## 2017-04-17 LAB — ABO/RH: ABO/RH(D): A NEG

## 2017-04-17 LAB — GLUCOSE, CAPILLARY
GLUCOSE-CAPILLARY: 89 mg/dL (ref 65–99)
GLUCOSE-CAPILLARY: 147 mg/dL — AB (ref 65–99)
Glucose-Capillary: 106 mg/dL — ABNORMAL HIGH (ref 65–99)
Glucose-Capillary: 76 mg/dL (ref 65–99)
Glucose-Capillary: 128 mg/dL — ABNORMAL HIGH (ref 65–99)

## 2017-04-17 LAB — MRSA PCR SCREENING: MRSA by PCR: NEGATIVE

## 2017-04-17 LAB — SURGICAL PCR SCREEN
MRSA, PCR: NEGATIVE
Staphylococcus aureus: POSITIVE — AB

## 2017-04-17 SURGERY — FIXATION, FRACTURE, INTERTROCHANTERIC, WITH INTRAMEDULLARY ROD
Anesthesia: General | Site: Hip | Laterality: Right

## 2017-04-17 MED ORDER — ONDANSETRON HCL 4 MG/2ML IJ SOLN: 4.0000 mg | Freq: Four times a day (QID) | INTRAMUSCULAR | Status: DC | PRN

## 2017-04-17 MED ORDER — HYDROCORTISONE ACETATE 25 MG RE SUPP
25.0000 mg | Freq: Two times a day (BID) | RECTAL | Status: DC | PRN
  Filled 2017-04-17: qty 1

## 2017-04-17 MED ORDER — METHOCARBAMOL 1000 MG/10ML IJ SOLN
500.0000 mg | Freq: Four times a day (QID) | INTRAVENOUS | Status: DC | PRN
  Filled 2017-04-17: qty 5

## 2017-04-17 MED ORDER — PHENYLEPHRINE HCL 10 MG/ML IJ SOLN
INTRAVENOUS | Status: DC | PRN
  Administered 2017-04-17: 15 ug/min via INTRAVENOUS

## 2017-04-17 MED ORDER — FENTANYL CITRATE (PF) 100 MCG/2ML IJ SOLN
50.0000 ug | Freq: Once | INTRAMUSCULAR | Status: AC
  Administered 2017-04-17: 50 ug via INTRAVENOUS
  Filled 2017-04-17: qty 2

## 2017-04-17 MED ORDER — ASPIRIN EC 81 MG PO TBEC: 81.0000 mg | DELAYED_RELEASE_TABLET | Freq: Two times a day (BID) | ORAL | 0 refills | Status: AC

## 2017-04-17 MED ORDER — ACETAMINOPHEN 325 MG PO TABS
650.0000 mg | ORAL_TABLET | Freq: Four times a day (QID) | ORAL | Status: DC | PRN
  Administered 2017-04-18: 650 mg via ORAL
  Filled 2017-04-17: qty 2

## 2017-04-17 MED ORDER — 0.9 % SODIUM CHLORIDE (POUR BTL) OPTIME
TOPICAL | Status: DC | PRN
  Administered 2017-04-17: 1000 mL

## 2017-04-17 MED ORDER — DOCUSATE SODIUM 100 MG PO CAPS
100.0000 mg | ORAL_CAPSULE | Freq: Two times a day (BID) | ORAL | Status: DC
  Administered 2017-04-18 – 2017-04-21 (×6): 100 mg via ORAL
  Filled 2017-04-17 (×7): qty 1

## 2017-04-17 MED ORDER — FENTANYL CITRATE (PF) 250 MCG/5ML IJ SOLN
INTRAMUSCULAR | Status: AC
Start: 2017-04-17 — End: 2017-04-17
  Filled 2017-04-17: qty 5

## 2017-04-17 MED ORDER — LACTATED RINGERS IV SOLN
INTRAVENOUS | Status: DC
  Administered 2017-04-17: 17:00:00 via INTRAVENOUS

## 2017-04-17 MED ORDER — DEXAMETHASONE SODIUM PHOSPHATE 10 MG/ML IJ SOLN
INTRAMUSCULAR | Status: DC | PRN
  Administered 2017-04-17: 10 mg via INTRAVENOUS

## 2017-04-17 MED ORDER — MIRTAZAPINE 15 MG PO TABS
15.0000 mg | ORAL_TABLET | Freq: Every day | ORAL | Status: DC
  Administered 2017-04-18 – 2017-04-20 (×3): 15 mg via ORAL
  Filled 2017-04-17 (×4): qty 1

## 2017-04-17 MED ORDER — SODIUM CHLORIDE 0.9 % IV SOLN
INTRAVENOUS | Status: DC
  Administered 2017-04-17 – 2017-04-20 (×5): via INTRAVENOUS

## 2017-04-17 MED ORDER — EPHEDRINE SULFATE 50 MG/ML IJ SOLN
INTRAMUSCULAR | Status: DC | PRN
  Administered 2017-04-17 (×2): 10 mg via INTRAVENOUS

## 2017-04-17 MED ORDER — INSULIN ASPART 100 UNIT/ML ~~LOC~~ SOLN
0.0000 [IU] | SUBCUTANEOUS | Status: DC
  Administered 2017-04-18: 3 [IU] via SUBCUTANEOUS
  Administered 2017-04-18: 2 [IU] via SUBCUTANEOUS
  Administered 2017-04-18: 1 [IU] via SUBCUTANEOUS
  Administered 2017-04-18: 2 [IU] via SUBCUTANEOUS
  Administered 2017-04-19: 1 [IU] via SUBCUTANEOUS
  Administered 2017-04-19 – 2017-04-20 (×4): 2 [IU] via SUBCUTANEOUS
  Administered 2017-04-20: 3 [IU] via SUBCUTANEOUS
  Administered 2017-04-20 – 2017-04-21 (×3): 1 [IU] via SUBCUTANEOUS

## 2017-04-17 MED ORDER — ONDANSETRON HCL 4 MG/2ML IJ SOLN
INTRAMUSCULAR | Status: DC | PRN
  Administered 2017-04-17: 4 mg via INTRAVENOUS

## 2017-04-17 MED ORDER — CEFAZOLIN SODIUM-DEXTROSE 2-4 GM/100ML-% IV SOLN
2.0000 g | INTRAVENOUS | Status: AC
  Administered 2017-04-17: 2 g via INTRAVENOUS
  Filled 2017-04-17 (×2): qty 100

## 2017-04-17 MED ORDER — HYDROCODONE-ACETAMINOPHEN 5-325 MG PO TABS
1.0000 | ORAL_TABLET | Freq: Four times a day (QID) | ORAL | Status: DC | PRN
  Administered 2017-04-17: 1 via ORAL
  Administered 2017-04-18: 2 via ORAL
  Administered 2017-04-19 – 2017-04-20 (×5): 1 via ORAL
  Administered 2017-04-21: 2 via ORAL
  Filled 2017-04-17: qty 1
  Filled 2017-04-17: qty 2
  Filled 2017-04-17: qty 1
  Filled 2017-04-17: qty 2
  Filled 2017-04-17: qty 1
  Filled 2017-04-17: qty 2
  Filled 2017-04-17 (×3): qty 1

## 2017-04-17 MED ORDER — FENTANYL CITRATE (PF) 100 MCG/2ML IJ SOLN
25.0000 ug | INTRAMUSCULAR | Status: DC | PRN
  Administered 2017-04-17: 25 ug via INTRAVENOUS

## 2017-04-17 MED ORDER — PROPOFOL 10 MG/ML IV BOLUS
INTRAVENOUS | Status: AC
  Filled 2017-04-17: qty 20

## 2017-04-17 MED ORDER — CHLORHEXIDINE GLUCONATE 4 % EX LIQD
60.0000 mL | Freq: Once | CUTANEOUS | Status: AC
  Administered 2017-04-17: 4 via TOPICAL

## 2017-04-17 MED ORDER — ONDANSETRON HCL 4 MG PO TABS: 4.0000 mg | ORAL_TABLET | Freq: Four times a day (QID) | ORAL | Status: DC | PRN

## 2017-04-17 MED ORDER — CEFAZOLIN SODIUM-DEXTROSE 1-4 GM/50ML-% IV SOLN
1.0000 g | Freq: Two times a day (BID) | INTRAVENOUS | Status: AC
  Administered 2017-04-18 (×2): 1 g via INTRAVENOUS
  Filled 2017-04-17 (×2): qty 50

## 2017-04-17 MED ORDER — FENTANYL CITRATE (PF) 100 MCG/2ML IJ SOLN
25.0000 ug | Freq: Once | INTRAMUSCULAR | Status: AC
  Administered 2017-04-17: 25 ug via INTRAVENOUS
  Filled 2017-04-17: qty 2

## 2017-04-17 MED ORDER — CYCLOBENZAPRINE HCL 10 MG PO TABS
10.0000 mg | ORAL_TABLET | Freq: Once | ORAL | Status: AC
  Administered 2017-04-17: 10 mg via ORAL
  Filled 2017-04-17: qty 1

## 2017-04-17 MED ORDER — FENTANYL CITRATE (PF) 100 MCG/2ML IJ SOLN
INTRAMUSCULAR | Status: DC | PRN
  Administered 2017-04-17: 50 ug via INTRAVENOUS

## 2017-04-17 MED ORDER — OXYCODONE HCL 5 MG PO TABS: 5.0000 mg | ORAL_TABLET | Freq: Once | ORAL | Status: DC | PRN

## 2017-04-17 MED ORDER — ROCURONIUM BROMIDE 50 MG/5ML IV SOLN
INTRAVENOUS | Status: AC
  Filled 2017-04-17: qty 4

## 2017-04-17 MED ORDER — METHOCARBAMOL 500 MG PO TABS
500.0000 mg | ORAL_TABLET | Freq: Four times a day (QID) | ORAL | Status: DC | PRN
Start: 2017-04-17 — End: 2017-04-21
  Administered 2017-04-21: 500 mg via ORAL
  Filled 2017-04-17: qty 1

## 2017-04-17 MED ORDER — PROPOFOL 10 MG/ML IV BOLUS
INTRAVENOUS | Status: DC | PRN
  Administered 2017-04-17: 100 mg via INTRAVENOUS

## 2017-04-17 MED ORDER — OXYCODONE HCL 5 MG/5ML PO SOLN: 5.0000 mg | Freq: Once | ORAL | Status: DC | PRN

## 2017-04-17 MED ORDER — PE-SHARK LIVER OIL-COCOA BUTTR 0.25-3-85.5 % RE SUPP: 1.0000 | Freq: Four times a day (QID) | RECTAL | Status: DC | PRN

## 2017-04-17 MED ORDER — FENTANYL CITRATE (PF) 100 MCG/2ML IJ SOLN
INTRAMUSCULAR | Status: AC
  Filled 2017-04-17: qty 2

## 2017-04-17 MED ORDER — LORAZEPAM 2 MG/ML IJ SOLN
0.5000 mg | Freq: Once | INTRAMUSCULAR | Status: AC
  Administered 2017-04-17: 0.5 mg via INTRAVENOUS
  Filled 2017-04-17: qty 1

## 2017-04-17 MED ORDER — MORPHINE SULFATE (PF) 4 MG/ML IV SOLN
0.5000 mg | INTRAVENOUS | Status: DC | PRN
  Administered 2017-04-17: 0.52 mg via INTRAVENOUS
  Filled 2017-04-17: qty 1

## 2017-04-17 MED ORDER — LIDOCAINE HCL (CARDIAC) 20 MG/ML IV SOLN
INTRAVENOUS | Status: DC | PRN
  Administered 2017-04-17: 100 mg via INTRAVENOUS

## 2017-04-17 MED ORDER — POVIDONE-IODINE 10 % EX SWAB
2.0000 "application " | Freq: Once | CUTANEOUS | Status: AC
  Administered 2017-04-17: 2 via TOPICAL

## 2017-04-17 MED ORDER — ACETAMINOPHEN 650 MG RE SUPP
650.0000 mg | Freq: Four times a day (QID) | RECTAL | Status: DC | PRN
Start: 2017-04-17 — End: 2017-04-21

## 2017-04-17 MED ORDER — PHENYLEPHRINE HCL 10 MG/ML IJ SOLN
INTRAMUSCULAR | Status: DC | PRN
  Administered 2017-04-17 (×2): 80 ug via INTRAVENOUS

## 2017-04-17 SURGICAL SUPPLY — 44 items
BIT DRILL FLUTED FEMUR 4.2/3 (BIT) ×3 IMPLANT
BLADE SURG 15 STRL LF DISP TIS (BLADE) ×1 IMPLANT
BLADE SURG 15 STRL SS (BLADE) ×3
BNDG COHESIVE 4X5 TAN NS LF (GAUZE/BANDAGES/DRESSINGS) ×3 IMPLANT
BNDG COHESIVE 6X5 TAN STRL LF (GAUZE/BANDAGES/DRESSINGS) IMPLANT
BNDG GAUZE ELAST 4 BULKY (GAUZE/BANDAGES/DRESSINGS) ×3 IMPLANT
COVER PERINEAL POST (MISCELLANEOUS) ×3 IMPLANT
COVER SURGICAL LIGHT HANDLE (MISCELLANEOUS) ×3 IMPLANT
DRAPE HALF SHEET 40X57 (DRAPES) IMPLANT
DRAPE STERI IOBAN 125X83 (DRAPES) ×3 IMPLANT
DRSG MEPILEX BORDER 4X4 (GAUZE/BANDAGES/DRESSINGS) ×9 IMPLANT
DRSG MEPILEX BORDER 4X8 (GAUZE/BANDAGES/DRESSINGS) ×3 IMPLANT
DRSG PAD ABDOMINAL 8X10 ST (GAUZE/BANDAGES/DRESSINGS) ×6 IMPLANT
DURAPREP 26ML APPLICATOR (WOUND CARE) ×3 IMPLANT
ELECT CAUTERY BLADE 6.4 (BLADE) ×3 IMPLANT
ELECT REM PT RETURN 9FT ADLT (ELECTROSURGICAL) ×3
ELECTRODE REM PT RTRN 9FT ADLT (ELECTROSURGICAL) ×1 IMPLANT
FACESHIELD WRAPAROUND (MASK) ×3 IMPLANT
GAUZE XEROFORM 5X9 LF (GAUZE/BANDAGES/DRESSINGS) ×3 IMPLANT
GLOVE BIO SURGEON STRL SZ7.5 (GLOVE) ×3 IMPLANT
GLOVE BIOGEL PI IND STRL 8 (GLOVE) ×1 IMPLANT
GLOVE BIOGEL PI INDICATOR 8 (GLOVE) ×2
GOWN STRL REUS W/ TWL LRG LVL3 (GOWN DISPOSABLE) ×2 IMPLANT
GOWN STRL REUS W/ TWL XL LVL3 (GOWN DISPOSABLE) ×1 IMPLANT
GOWN STRL REUS W/TWL LRG LVL3 (GOWN DISPOSABLE) ×6
GOWN STRL REUS W/TWL XL LVL3 (GOWN DISPOSABLE) ×3
GUIDEWIRE 3.2X400 (WIRE) ×3 IMPLANT
KIT BASIN OR (CUSTOM PROCEDURE TRAY) ×3 IMPLANT
KIT ROOM TURNOVER OR (KITS) ×3 IMPLANT
LINER BOOT UNIVERSAL DISP (MISCELLANEOUS) ×3 IMPLANT
MANIFOLD NEPTUNE II (INSTRUMENTS) ×3 IMPLANT
NAIL TROCH FIX 10X235 RT 130 (Nail) ×3 IMPLANT
NS IRRIG 1000ML POUR BTL (IV SOLUTION) ×3 IMPLANT
PACK GENERAL/GYN (CUSTOM PROCEDURE TRAY) ×3 IMPLANT
PAD ARMBOARD 7.5X6 YLW CONV (MISCELLANEOUS) ×6 IMPLANT
PAD CAST 4YDX4 CTTN HI CHSV (CAST SUPPLIES) ×2 IMPLANT
PADDING CAST COTTON 4X4 STRL (CAST SUPPLIES) ×6
SCREW LOCKING 5.0X34MM (Screw) ×3 IMPLANT
SCREW TFNA 90MM HIP (Orthopedic Implant) ×3 IMPLANT
STAPLER VISISTAT 35W (STAPLE) ×3 IMPLANT
SUT MON AB 2-0 CT1 36 (SUTURE) ×3 IMPLANT
TOWEL OR 17X24 6PK STRL BLUE (TOWEL DISPOSABLE) ×3 IMPLANT
TOWEL OR 17X26 10 PK STRL BLUE (TOWEL DISPOSABLE) ×3 IMPLANT
WATER STERILE IRR 1000ML POUR (IV SOLUTION) ×3 IMPLANT

## 2017-04-17 NOTE — Progress Notes (Signed)
Received patient from PACU, assessment completed, VS documented, patient asleep and appears in no distress at this time.  Will continue to monitor.

## 2017-04-17 NOTE — Progress Notes (Signed)
Patient seen and examined at bedside, admitted after midnight, please see earlier admission note by Dr. Onalee Huaavid. Pt admitted after an episode of fall and was found to have sustained right femur fracture. Still in pain this AM, fmaily at bedside, awaiting for ortho input, ? conservative vs surgical management. Pt is DNR and family prefers conservative approach if possible.   Debbora PrestoMAGICK-Teneshia Hedeen, MD  [facility] Pager [phone]  If 7PM-7AM, please contact night-coverage [email or web address] Password [email or web address]

## 2017-04-17 NOTE — Progress Notes (Signed)
Orthopedic Tech Progress Note Patient Details:  Emily KiltsMary Katherine Werner 08/01/1917 [id number]  Musculoskeletal Traction Type of Traction: Bucks Skin Traction Traction Location: rle Traction Weight: 5 lbs    Trinna PostMartinez, [name] 04/17/2017, 6:38 AM

## 2017-04-17 NOTE — Anesthesia Preprocedure Evaluation (Addendum)
Anesthesia Evaluation  Patient identified by MRN, date of birth, ID band Patient awake    Reviewed: Allergy & Precautions, H&P , NPO status , Patient's Chart, lab work & pertinent test results  Airway Mallampati: II   Neck ROM: full    Dental   Pulmonary neg pulmonary ROS,    breath sounds clear to auscultation       Cardiovascular negative cardio ROS   Rhythm:regular Rate:Normal     Neuro/Psych dementia   GI/Hepatic   Endo/Other  diabetes, Type 2  Renal/GU      Musculoskeletal   Abdominal   Peds  Hematology  (+) Blood dyscrasia, anemia ,   Anesthesia Other Findings   Reproductive/Obstetrics                                                             Anesthesia Evaluation  Patient identified by MRN, date of birth, ID band Patient awake    Reviewed: Allergy & Precautions, H&P , NPO status , Patient's Chart, lab work & pertinent test results  Airway Mallampati: II   Neck ROM: full    Dental   Pulmonary neg pulmonary ROS,    breath sounds clear to auscultation       Cardiovascular negative cardio ROS   Rhythm:regular Rate:Normal     Neuro/Psych dementia   GI/Hepatic   Endo/Other  diabetes, Type 2  Renal/GU      Musculoskeletal   Abdominal   Peds  Hematology  (+) Blood dyscrasia, anemia ,   Anesthesia Other Findings   Reproductive/Obstetrics                             Anesthesia Physical Anesthesia Plan  ASA: III  Anesthesia Plan: General   Post-op Pain Management:    Induction:   PONV Risk Score and Plan: 4 or greater and Ondansetron, Dexamethasone, Propofol and Treatment may vary due to age or medical condition  Airway Management Planned: Oral ETT  Additional Equipment:   Intra-op Plan:   Post-operative Plan: Extubation in OR  Informed Consent: I have reviewed the patients History and Physical, chart, labs  and discussed the procedure including the risks, benefits and alternatives for the proposed anesthesia with the patient or authorized representative who has indicated his/her understanding and acceptance.     Plan Discussed with: CRNA, Anesthesiologist and Surgeon  Anesthesia Plan Comments:         Anesthesia Quick Evaluation  Anesthesia Physical Anesthesia Plan  ASA: III  Anesthesia Plan: General   Post-op Pain Management:    Induction:   PONV Risk Score and Plan: 4 or greater and Ondansetron, Dexamethasone, Propofol and Treatment may vary due to age or medical condition  Airway Management Planned: Oral ETT  Additional Equipment:   Intra-op Plan:   Post-operative Plan: Extubation in OR  Informed Consent: I have reviewed the patients History and Physical, chart, labs and discussed the procedure including the risks, benefits and alternatives for the proposed anesthesia with the patient or authorized representative who has indicated his/her understanding and acceptance.     Plan Discussed with: CRNA, Anesthesiologist and Surgeon  Anesthesia Plan Comments:         Anesthesia Quick Evaluation

## 2017-04-17 NOTE — ED Notes (Signed)
Attempted to reposition pt. Pt refused and stated that she was more comfortable laying on the affected extremity. Pt continued to scream out in pain, offered pt ice pack and pt refused. RN Georgann HousekeeperMichael Doss notified

## 2017-04-17 NOTE — Op Note (Signed)
Date of Surgery: 04/17/2017  INDICATIONS: Ms. Emily Werner is a 83 y.o.-year-old female who sustained a right hip fracture. She has very advanced dementia and sustained a fall at her skilled nursing facility. She was found to have a right intertrochanteric hip fracture up at [facility]. Due to her dementia I discussed her injury and recommendation for intramedullary nail fixation with her son and daughter who are her health care power of attorney. The risks and benefits of the procedure discussed, including but not limited to bleeding, infection, damage to neurovascular structures, malunion, nonunion, failure of hardware, painful hardware, leg length inequality, screw cut out, with the family prior to the procedure and all questions were answered; consent was obtained.  PREOPERATIVE DIAGNOSIS: right hip fracture   POSTOPERATIVE DIAGNOSIS: Same   PROCEDURE: Treatment of intertrochanteric, pertrochanteric, subtrochanteric fracture with intramedullary implant. CPT [id number]   SURGEON: Kathi DerJason P. Aundria Rudogers, M.D.   ANESTHESIA: general   IV FLUIDS AND URINE: See anesthesia record   ESTIMATED BLOOD LOSS: 150 mL  IMPLANTS:  Synthes TFN nail   10 mm diameter by European length 90 mm compression screw 5.0 x 34 mm interlock screw  DRAINS: None.   COMPLICATIONS: None.   DESCRIPTION OF PROCEDURE: The patient was brought to the operating room and placed supine on the operating table. The patient's leg had been signed prior to the procedure. The patient had the anesthesia placed by the anesthesiologist. The prep verification and incision time-outs were performed to confirm that this was the correct patient, site, side and location. The patient had an SCD on the opposite lower extremity. The patient did receive antibiotics prior to the incision and was re-dosed during the procedure as needed at indicated intervals. The patient was positioned on the fracture table with the table in traction and internal  rotation to reduce the hip. The well leg was placed in a scissor position and all bony prominences were well-padded. The patient had the lower extremity prepped and draped in the standard surgical fashion. The incision was made 4 finger breadths superior to the greater trochanter. A guide pin was inserted into the tip of the greater trochanter under fluoroscopic guidance. An opening reamer was used to gain access to the femoral canal. The nail length was measured and inserted down the femoral canal to its proper depth. The appropriate version of insertion for the lag screw was found under fluoroscopy. A pin was inserted up the femoral neck through the jig. The length of the lag screw was then measured. The lag screw was inserted as near to center-center in the head as possible. The leg was taken out of traction, then the interdigitating compression screw was used to compress across the fracture. Compression was visualized on serial xrays. we next in our attention to the distal interlocking screw. Utilizing the interlock screw slot on the nail insertion handle we made a percutaneous incision lateral thigh. The drill guide was inserted through the hand rig and we drilled across bilateral cortices through the nail. This was measured off the drill guide in the appropriate screw was placed under hand power. X-rays confirm the proper length of the screw.    The wound was copiously irrigated with saline and the subcutaneous layer closed with 2.0 vicryl and the skin was reapproximated with staples. The wounds were cleaned and dried a final time and a sterile dressing was placed. The hip was taken through a range of motion at the end of the case under fluoroscopic imaging to  visualize the approach-withdraw phenomenon and confirm implant length in the head. The patient was then awakened from anesthesia and taken to the recovery room in stable condition. All counts were correct at the end of the case.   POSTOPERATIVE PLAN:  The patient will be weight bearing as tolerated and will return in 2 weeks for staple removal and the patient will receive DVT prophylaxis based on other medications, activity level, and risk ratio of bleeding to thrombosis.   Emily Rued, MD [facility] [phone] 6:18 PM

## 2017-04-17 NOTE — Anesthesia Procedure Notes (Signed)
Procedure Name: Intubation Date/Time: 04/17/2017 5:28 PM Performed by: Oletta Lamas Pre-anesthesia Checklist: Patient identified, Emergency Drugs available, Suction available and Patient being monitored Patient Re-evaluated:Patient Re-evaluated prior to inductionOxygen Delivery Method: Circle System Utilized Preoxygenation: Pre-oxygenation with 100% oxygen Intubation Type: IV induction Ventilation: Mask ventilation without difficulty Laryngoscope Size: Mac and 3 Grade View: Grade I Tube type: Oral Tube size: 7.0 mm Number of attempts: 1 Airway Equipment and Method: Stylet Placement Confirmation: ETT inserted through vocal cords under direct vision,  positive ETCO2 and breath sounds checked- equal and bilateral Secured at: 21 cm Tube secured with: Tape Dental Injury: Teeth and Oropharynx as per pre-operative assessment

## 2017-04-17 NOTE — Transfer of Care (Signed)
Immediate Anesthesia Transfer of Care Note  Patient: Emily Werner  Procedure(s) Performed: Procedure(s): INTRAMEDULLARY (IM) NAIL INTERTROCHANTRIC (Right)  Patient Location: PACU  Anesthesia Type:General  Level of Consciousness: drowsy, patient cooperative and responds to stimulation Resting with eyes closed.    Airway & Oxygen Therapy: Patient Spontanous Breathing and Patient connected to nasal cannula oxygen RR even and unlabored  Post-op Assessment: Report given to RN and Post -op Vital signs reviewed and stable  Post vital signs: Reviewed and stable  Last Vitals:  Vitals:   04/17/17 0555 04/17/17 1836  BP: (!) 119/54   Pulse: 70   Resp: 18   Temp: 36.9 C (!) (P) 36.3 C    Last Pain:  Vitals:   04/17/17 1140  TempSrc:   PainSc: Asleep         Complications: No apparent anesthesia complications

## 2017-04-17 NOTE — H&P (Signed)
History and Physical    Emily Werner JXB:[id number] DOB: [birth year] DOA: 04/16/2017  PCP: Patient, No Pcp Per  Patient coming from: snf  Chief Complaint:  Found down  HPI: Emily Werner is a 83 y.o. female with medical history significant of DM, dementia comes in after found down at [facility] complaining of pain.  Unknown if LOC but there was no evident head injury and no blood at scene.  Pt had been in her normal state of health.  She cannot provide any history.  Found to have femur fx, and [facility].    Review of Systems: unobtainable due to dementia  Past Medical History:  Diagnosis Date  . Alzheimer's dementia   . Diabetes mellitus   . Thyroid disease     Past Surgical History:  Procedure Laterality Date  . KNEE SURGERY       reports that she has never smoked. She has never used smokeless tobacco. She reports that she does not drink alcohol. Her drug history is not on file.  No Known Allergies  No family history on file. unknown  Prior to Admission medications   Medication Sig Start Date End Date Taking? Authorizing Provider  donepezil (ARICEPT) 10 MG tablet Take 10 mg by mouth daily.    [provider]  fish oil-omega-3 fatty acids 1000 MG capsule Take 1 g by mouth daily.    [provider]  Ginkgo Biloba 100 MG CAPS Take 100 mg by mouth daily.    [provider]  glimepiride (AMARYL) 2 MG tablet Take 2 mg by mouth daily before breakfast.    [provider]  lansoprazole (PREVACID) 30 MG capsule Take 30 mg by mouth daily.    [provider]  levothyroxine (SYNTHROID, LEVOTHROID) 112 MCG tablet Take 112 mcg by mouth daily.    [provider]  Multiple Vitamin (MULITIVITAMIN WITH MINERALS) TABS Take 1 tablet by mouth daily.    [provider]    Physical Exam: Vitals:   04/16/17 2338 04/17/17 0100 04/17/17 0145 04/17/17 0200  BP:  (!) 128/97  (!) 137/56  Pulse:    77 (!) 45  Resp:  (!) 25 (!) 21 17  Temp:      TempSrc:      SpO2:   (!) 83% 99%  Weight: 78.5 kg (173 lb)     Height: 5\' 3"  (1.6 m)         Constitutional: cries out in pain, cannot follow commands and keeps moving her leg as she does not understand she broke it Vitals:   04/16/17 2338 04/17/17 0100 04/17/17 0145 04/17/17 0200  BP:  (!) 128/97  (!) 137/56  Pulse:   77 (!) 45  Resp:  (!) 25 (!) 21 17  Temp:      TempSrc:      SpO2:   (!) 83% 99%  Weight: 78.5 kg (173 lb)     Height: 5\' 3"  (1.6 m)      Eyes: PERRL, lids and conjunctivae normal ENMT: Mucous membranes are moist. Posterior pharynx clear of any exudate or lesions.Normal dentition.  Neck: normal, supple, no masses, no thyromegaly Respiratory: clear to auscultation bilaterally, no wheezing, no crackles. Normal respiratory effort. No accessory muscle use.  Cardiovascular: Regular rate and rhythm, no murmurs / rubs / gallops. No extremity edema. 2+ pedal pulses. No carotid bruits.  Abdomen: no tenderness, no masses palpated. No hepatosplenomegaly. Bowel sounds positive.  Musculoskeletal: no clubbing /  cyanosis. No joint deformity upper and lower extremities. Good ROM, no contractures. Normal muscle tone.  Skin: no rashes, lesions, ulcers. No induration Neurologic: CN 2-12 grossly intact. Sensation intact, DTR normal. Strength 5/5 in all 4.  Psychiatric: pleaseantly demented and in pain  Labs on Admission: I have personally reviewed following labs and imaging studies  CBC:  Recent Labs Lab 04/17/17 0140  WBC 13.0*  NEUTROABS 8.6*  HGB 8.5*  HCT 26.5*  MCV 81.0  PLT 366   Basic Metabolic Panel:  Recent Labs Lab 04/17/17 0140  NA 141  K 3.5  CL 106  CO2 23  GLUCOSE 69  BUN 14  CREATININE 1.01*  CALCIUM 8.8*   GFR: Estimated Creatinine Clearance: 31.6 mL/min (A) (by C-G formula based on SCr of 1.01 mg/dL (H)). Liver Function Tests:  Recent Labs Lab 04/17/17 0140  AST 19  ALT 15  ALKPHOS  148*  BILITOT 0.6  PROT 6.6  ALBUMIN 3.4*       Radiological Exams on Admission: Dg Chest 1 View  Result Date: 04/17/2017 CLINICAL DATA:  Status post fall on right hip, with concern for chest injury. Initial encounter. EXAM: CHEST 1 VIEW COMPARISON:  Chest radiograph performed 03/29/2017 FINDINGS: The lungs are well-aerated. There is elevation of the right hemidiaphragm, with right basilar atelectasis. There is no evidence of pleural effusion or pneumothorax. The cardiomediastinal silhouette is borderline normal in size. No acute osseous abnormalities are seen. IMPRESSION: Elevation of the right hemidiaphragm, with right basilar atelectasis. No displaced rib fractures identified. Electronically Signed   By: Roanna Raider M.D.   On: 04/17/2017 01:08   Dg Hip Unilat W Or Wo Pelvis 2-3 Views Right  Result Date: 04/17/2017 CLINICAL DATA:  Status post fall on right hip, with right hip pain. Initial encounter. EXAM: DG HIP (WITH OR WITHOUT PELVIS) 2-3V RIGHT COMPARISON:  None. FINDINGS: There is a displaced right femoral intertrochanteric fracture. No additional fractures are seen. The right femoral head remains seated at the acetabulum. The left hip joint is grossly unremarkable. There is mild chronic deformity of the left inferior pubic ramus. The sacroiliac joints demonstrate mild sclerosis. The visualized bowel gas pattern is grossly unremarkable. No definite soft tissue abnormalities are characterized on radiograph. IMPRESSION: Displaced right femoral intertrochanteric fracture. Electronically Signed   By: Roanna Raider M.D.   On: 04/17/2017 01:07   Dg Femur, Min 2 Views Right  Result Date: 04/17/2017 CLINICAL DATA:  Status post fall, with right hip pain. Initial encounter. EXAM: RIGHT FEMUR 2 VIEWS COMPARISON:  None. FINDINGS: There is a mildly displaced intertrochanteric fracture through the proximal right femur. No additional fractures are seen. The right femoral head remains seated at the  acetabulum. The knee joint is grossly unremarkable. No knee joint effusion is identified. No definite soft tissue abnormalities are characterized on radiograph. IMPRESSION: Mildly displaced intertrochanteric fracture through the proximal right femur. Electronically Signed   By: Roanna Raider M.D.   On: 04/17/2017 01:05    EKG: Independently reviewed. Poor quality afib, repeating cxr reviewed no edema or infiltrate Old chart reviewed DNR MOSE form at bedside Case discussed twice with dr rancour   Assessment/Plan 83 yo pleasantly demented female with fall and now right femur fx  Principal Problem:   Fracture- right, transfer to [facility] for ortho to repair.  Keep npo.  Not on anticoagulants or antiplatelets.  Prn morphine ordered.  Given ativan in ED, may need more  Active Problems:   Alzheimer's dementia- noted   DM (  diabetes mellitus) (HCC)- hold metformin.  SSI q 4 hour glucose checks  recheck EKG 12 lead   DVT prophylaxis:  None for now, scds I believe would cause her more pain needs to be addressed postop today Code Status:  DNR Family Communication: none  Disposition Plan:  Per day team Consults called:  orthopedic Admission status: admission   Bravery Ketcham A MD [facility]  If 7PM-7AM, please contact night-coverage [email or web address] Password [email or web address]  04/17/2017, 3:21 AM

## 2017-04-17 NOTE — ED Provider Notes (Signed)
[facility]-EMERGENCY DEPT Provider Note   CSN: [id number] Arrival date & time: 04/16/17  2328     History   Chief Complaint Chief Complaint  Patient presents with  . Leg Pain    HPI Emily Werner is a 83 y.o. female.  Level 5 caveat for dementia. Patient from assisted living facility after apparently falling onto right hip. She does not remember falling. Complains of pain to right hip and thigh. Denies head injury. No head or neck pain. No chest pain, abdominal pain. She is screaming in pain and hard to redirect. She continues to move her right hip despite being told not to.   The history is provided by the patient.  Leg Pain      Past Medical History:  Diagnosis Date  . Alzheimer's dementia   . Diabetes mellitus   . Thyroid disease     Patient Active Problem List   Diagnosis Date Noted  . Congenital prolapsed rectum     Past Surgical History:  Procedure Laterality Date  . KNEE SURGERY      OB History    No data available       Home Medications    Prior to Admission medications   Medication Sig Start Date End Date Taking? Authorizing Provider  donepezil (ARICEPT) 10 MG tablet Take 10 mg by mouth daily.    [provider]  fish oil-omega-3 fatty acids 1000 MG capsule Take 1 g by mouth daily.    [provider]  Ginkgo Biloba 100 MG CAPS Take 100 mg by mouth daily.    [provider]  glimepiride (AMARYL) 2 MG tablet Take 2 mg by mouth daily before breakfast.    [provider]  lansoprazole (PREVACID) 30 MG capsule Take 30 mg by mouth daily.    [provider]  levothyroxine (SYNTHROID, LEVOTHROID) 112 MCG tablet Take 112 mcg by mouth daily.    [provider]  Multiple Vitamin (MULITIVITAMIN WITH MINERALS) TABS Take 1 tablet by mouth daily.    [provider]    Family History No family history on file.  Social History Social History  Substance Use Topics  . Smoking status: Never Smoker    . Smokeless tobacco: Never Used  . Alcohol use No     Allergies   Patient has no known allergies.   Review of Systems Review of Systems  Unable to perform ROS: Dementia     Physical Exam Updated Vital Signs BP 129/67 (BP Location: Left Arm)   Pulse 97   Temp 98.3 F (36.8 C) (Oral)   Resp (!) 23   Ht 5\' 3"  (1.6 m)   Wt 78.5 kg (173 lb)   SpO2 96%   BMI 30.65 kg/m   Physical Exam  Constitutional: She is oriented to person, place, and time. She appears well-developed and well-nourished. No distress.  Screaming in pain, moving R hip despite being told not to  HENT:  Head: Normocephalic and atraumatic.  Mouth/Throat: Oropharynx is clear and moist. No oropharyngeal exudate.  Eyes: Conjunctivae and EOM are normal. Pupils are equal, round, and reactive to light.  Neck: Normal range of motion. Neck supple.  No C spine pain  Cardiovascular: Normal rate, regular rhythm, normal heart sounds and intact distal pulses.   No murmur heard. Pulmonary/Chest: Effort normal and breath sounds normal. No respiratory distress.  Abdominal: Soft. There is no tenderness. There is no rebound and no guarding.  Musculoskeletal: Normal range of motion. She exhibits tenderness. She  exhibits no edema.  Flexing R hip. No apparent deformity. Intact DP and PT pulses  Neurological: She is alert and oriented to person, place, and time. No cranial nerve deficit. She exhibits normal muscle tone. Coordination normal.   5/5 strength throughout. CN 2-12 intact.Equal grip strength.   Skin: Skin is warm.  Psychiatric: She has a normal mood and affect. Her behavior is normal.  Nursing note and vitals reviewed.    ED Treatments / Results  Labs (all labs ordered are listed, but only abnormal results are displayed) Labs Reviewed  CBC WITH DIFFERENTIAL/PLATELET - Abnormal; Notable for the following:       Result Value   WBC 13.0 (*)    RBC 3.27 (*)    Hemoglobin 8.5 (*)    HCT 26.5 (*)    RDW 16.1 (*)     Neutro Abs 8.6 (*)    Monocytes Absolute 1.2 (*)    All other components within normal limits  COMPREHENSIVE METABOLIC PANEL - Abnormal; Notable for the following:    Creatinine, Ser 1.01 (*)    Calcium 8.8 (*)    Albumin 3.4 (*)    Alkaline Phosphatase 148 (*)    GFR calc non Af Amer 45 (*)    GFR calc Af Amer 52 (*)    All other components within normal limits    EKG  EKG Interpretation  Date/Time:  Friday April 17 2017 00:16:28 EDT Ventricular Rate:  85 PR Interval:    QRS Duration: 135 QT Interval:  425 QTC Calculation: 466 R Axis:   -61 Text Interpretation:  probably sinus with prolonged PR RBBB and LAFB Nonspecific T abnormalities, lateral leads Interpretation limited secondary to artifact Confirmed by Glynn Octave [phone]) on 04/17/2017 2:33:57 AM       Radiology Dg Chest 1 View  Result Date: 04/17/2017 CLINICAL DATA:  Status post fall on right hip, with concern for chest injury. Initial encounter. EXAM: CHEST 1 VIEW COMPARISON:  Chest radiograph performed 03/29/2017 FINDINGS: The lungs are well-aerated. There is elevation of the right hemidiaphragm, with right basilar atelectasis. There is no evidence of pleural effusion or pneumothorax. The cardiomediastinal silhouette is borderline normal in size. No acute osseous abnormalities are seen. IMPRESSION: Elevation of the right hemidiaphragm, with right basilar atelectasis. No displaced rib fractures identified. Electronically Signed   By: Roanna Raider M.D.   On: 04/17/2017 01:08   Dg Hip Unilat W Or Wo Pelvis 2-3 Views Right  Result Date: 04/17/2017 CLINICAL DATA:  Status post fall on right hip, with right hip pain. Initial encounter. EXAM: DG HIP (WITH OR WITHOUT PELVIS) 2-3V RIGHT COMPARISON:  None. FINDINGS: There is a displaced right femoral intertrochanteric fracture. No additional fractures are seen. The right femoral head remains seated at the acetabulum. The left hip joint is grossly unremarkable. There is mild  chronic deformity of the left inferior pubic ramus. The sacroiliac joints demonstrate mild sclerosis. The visualized bowel gas pattern is grossly unremarkable. No definite soft tissue abnormalities are characterized on radiograph. IMPRESSION: Displaced right femoral intertrochanteric fracture. Electronically Signed   By: Roanna Raider M.D.   On: 04/17/2017 01:07   Dg Femur, Min 2 Views Right  Result Date: 04/17/2017 CLINICAL DATA:  Status post fall, with right hip pain. Initial encounter. EXAM: RIGHT FEMUR 2 VIEWS COMPARISON:  None. FINDINGS: There is a mildly displaced intertrochanteric fracture through the proximal right femur. No additional fractures are seen. The right femoral head remains seated at the acetabulum. The knee joint is  grossly unremarkable. No knee joint effusion is identified. No definite soft tissue abnormalities are characterized on radiograph. IMPRESSION: Mildly displaced intertrochanteric fracture through the proximal right femur. Electronically Signed   By: Roanna Raider M.D.   On: 04/17/2017 01:05    Procedures Procedures (including critical care time)  Medications Ordered in ED Medications  fentaNYL (SUBLIMAZE) injection 50 mcg (50 mcg Intravenous Given 04/17/17 0018)     Initial Impression / Assessment and Plan / ED Course  I have reviewed the triage vital signs and the nursing notes.  Pertinent labs & imaging results that were available during my care of the patient were reviewed by me and considered in my medical decision making (see chart for details).    R hip pain. Neurovascularly intact. No apparent head trauma. No blood thinner use.   X-ray confirms right intertrochanteric hip fracture. Patient refuses to keep the leg still and continues to lay on it. Will try additional pain medication and knee immobilizer to discourage movement of the leg.   Discussed with Dr. Aundria Rud in orthopedic surgery. He agrees with transfer him to the hospital service to [facility]  for surgery tomorrow. Keep patient NPO.  Labs with new anemia but no values for 5 years.   Admission to [facility] d/w Dr. Onalee Hua.   Final Clinical Impressions(s) / ED Diagnoses   Final diagnoses:  Closed intertrochanteric fracture of hip, right, initial encounter [facility])    New Prescriptions New Prescriptions   No medications on file     Glynn Octave, MD 04/17/17 [phone]

## 2017-04-17 NOTE — Consult Note (Signed)
ORTHOPAEDIC CONSULTATION  REQUESTING PHYSICIAN: Dorothea OgleMyers, Iskra M, MD  PCP:  Patient, No Pcp Per  Chief Complaint: Right hip pain/fracture  HPI: Emily EaringMary Katherine Werner is a 83 y.o. female who complains of right hip pain following a fall at her assisted living facility.  She has a comminuted medical history including severe dementia as well as diabetes mellitus. She recently is overcoming so bacteremia as well as a urinary tract infection with Escherichia coli. She has completed the interbody course for that treatment.  Due to her dementia the history of present illness this note was obtained via chart review and Medication with her son and daughter. Early this morning she was found to have a right intertrochanteric hip fracture [facility]. She is coming [facility] to [facility] for definitive management today.  Past Medical History:  Diagnosis Date  . Alzheimer's dementia   . Diabetes mellitus   . Thyroid disease    Past Surgical History:  Procedure Laterality Date  . KNEE SURGERY     Social History   Social History  . Marital status: Widowed    Spouse name: N/A  . Number of children: N/A  . Years of education: N/A   Social History Main Topics  . Smoking status: Never Smoker  . Smokeless tobacco: Never Used  . Alcohol use No  . Drug use: Unknown  . Sexual activity: Not Asked   Other Topics Concern  . None   Social History Narrative  . None   No family history on file. No Known Allergies Prior to Admission medications   Medication Sig Start Date End Date Taking? Authorizing Provider  acetaminophen (TYLENOL) 500 MG tablet Take 500 mg by mouth daily.   Yes [provider]  aspirin 325 MG tablet Take 325 mg by mouth daily.   Yes [provider]  Calcium Carbonate (CALCIUM 600 PO) Take 600 mg by mouth 2 (two) times daily.   Yes [provider]  diphenhydrAMINE (BENADRYL) 25 mg capsule Take 25 mg by mouth every 4 (four) hours  as needed (for allergic reaction).   Yes [provider]  donepezil (ARICEPT) 10 MG tablet Take 10 mg by mouth daily.   Yes [provider]  glimepiride (AMARYL) 4 MG tablet Take 4 mg by mouth daily before breakfast.    Yes [provider]  levothyroxine (SYNTHROID, LEVOTHROID) 88 MCG tablet Take 88 mcg by mouth daily.    Yes [provider]  mirtazapine (REMERON) 15 MG tablet Take 15 mg by mouth at bedtime.   Yes [provider]  pantoprazole (PROTONIX) 40 MG tablet Take 40 mg by mouth daily.   Yes [provider]  polyethylene glycol (MIRALAX / GLYCOLAX) packet Take 17 g by mouth daily.   Yes [provider]  shark liver oil-cocoa butter (PREPARATION H) 0.25-3-85.5 % suppository Place 1 suppository rectally 4 (four) times daily as needed for hemorrhoids.   Yes [provider]  vitamin B-12 (CYANOCOBALAMIN) 500 MCG tablet Take 500 mcg by mouth daily.   Yes [provider]   Dg Chest 1 View  Result Date: 04/17/2017 CLINICAL DATA:  Status post fall on right hip, with concern for chest injury. Initial encounter. EXAM: CHEST 1 VIEW COMPARISON:  Chest radiograph performed 03/29/2017 FINDINGS: The lungs are well-aerated. There is elevation of the right hemidiaphragm, with right basilar atelectasis. There is no evidence of pleural effusion or pneumothorax. The cardiomediastinal silhouette is borderline normal in size. No acute osseous abnormalities  are seen. IMPRESSION: Elevation of the right hemidiaphragm, with right basilar atelectasis. No displaced rib fractures identified. Electronically Signed   By: Roanna Raider M.D.   On: 04/17/2017 01:08   Dg Hip Unilat W Or Wo Pelvis 2-3 Views Right  Result Date: 04/17/2017 CLINICAL DATA:  Status post fall on right hip, with right hip pain. Initial encounter. EXAM: DG HIP (WITH OR WITHOUT PELVIS) 2-3V RIGHT COMPARISON:  None. FINDINGS: There is a displaced right femoral  intertrochanteric fracture. No additional fractures are seen. The right femoral head remains seated at the acetabulum. The left hip joint is grossly unremarkable. There is mild chronic deformity of the left inferior pubic ramus. The sacroiliac joints demonstrate mild sclerosis. The visualized bowel gas pattern is grossly unremarkable. No definite soft tissue abnormalities are characterized on radiograph. IMPRESSION: Displaced right femoral intertrochanteric fracture. Electronically Signed   By: Roanna Raider M.D.   On: 04/17/2017 01:07   Dg Femur, Min 2 Views Right  Result Date: 04/17/2017 CLINICAL DATA:  Status post fall, with right hip pain. Initial encounter. EXAM: RIGHT FEMUR 2 VIEWS COMPARISON:  None. FINDINGS: There is a mildly displaced intertrochanteric fracture through the proximal right femur. No additional fractures are seen. The right femoral head remains seated at the acetabulum. The knee joint is grossly unremarkable. No knee joint effusion is identified. No definite soft tissue abnormalities are characterized on radiograph. IMPRESSION: Mildly displaced intertrochanteric fracture through the proximal right femur. Electronically Signed   By: Roanna Raider M.D.   On: 04/17/2017 01:05    Positive ROS: All other systems have been reviewed and were otherwise negative with the exception of those mentioned in the HPI and as above.  Physical Exam: General:  no acute distress Cardiovascular: No pedal edema Respiratory: No cyanosis, no use of accessory musculature GI: No organomegaly, abdomen is soft and non-tender Skin: No lesions in the area of chief complaint Neurologic: Sensation intact distally Psychiatric: Patient is not competent for consent  Lymphatic: No axillary or cervical lymphadenopathy  MUSCULOSKELETAL:  Right lower extremity:  She spontaneously moves toes and ankle. Skin is warm and well-perfused. She responds light touch when dorsum plantar aspect of the foot. 2+ dorsalis  pulse.   Assessment: Right hip intertrochanteric fracture.  Plan: -Review the x-rays as well as the injury with her son and daughter at the bedside. Our plan is for intramedullary nailing of the right hip fracture. -We discussed that given her advanced age and dementia she is high risk for perioperative morbidity and mortality. Mortality risk for her is between 30 and 40%. -Family desires to proceed with surgical intervention to allow early ambulation and to get her out of bed. -The risks, benefits, and alternatives were discussed with the patient. There are risks associated with the surgery including, but not limited to, problems with anesthesia (death), infection, differences in leg length/angulation/rotation, fracture of bones, loosening or failure of implants, malunion, nonunion, hematoma (blood accumulation) which may require surgical drainage, blood clots, pulmonary embolism, nerve injury (foot drop), and blood vessel injury. The patient's children, who are her healthcare power of attorney, understands these risks and elects to proceed. -Postoperatively she'll return to the medicine floor for perioperative medical management as well as dispositional planning.     Yolonda Kida, MD Cell [phone]    04/17/2017 8:51 AM

## 2017-04-18 ENCOUNTER — Encounter (HOSPITAL_COMMUNITY): Payer: Self-pay

## 2017-04-18 LAB — BASIC METABOLIC PANEL
Anion gap: 9 (ref 5–15)
BUN: 11 mg/dL (ref 6–20)
CALCIUM: 7.9 mg/dL — AB (ref 8.9–10.3)
CO2: 22 mmol/L (ref 22–32)
CREATININE: 0.96 mg/dL (ref 0.44–1.00)
Chloride: 106 mmol/L (ref 101–111)
GFR calc non Af Amer: 48 mL/min — ABNORMAL LOW (ref >60)
GFR, EST AFRICAN AMERICAN: 56 mL/min — AB (ref >60)
GLUCOSE: 201 mg/dL — AB (ref 65–99)
Potassium: 4.4 mmol/L (ref 3.5–5.1)
Sodium: 137 mmol/L (ref 135–145)

## 2017-04-18 LAB — CBC
HCT: 24.8 % — ABNORMAL LOW (ref 36.0–46.0)
HCT: 26.9 % — ABNORMAL LOW (ref 36.0–46.0)
HEMOGLOBIN: 8.3 g/dL — AB (ref 12.0–15.0)
Hemoglobin: 7.6 g/dL — ABNORMAL LOW (ref 12.0–15.0)
MCH: 25.2 pg — AB (ref 26.0–34.0)
MCH: 25.8 pg — AB (ref 26.0–34.0)
MCHC: 30.6 g/dL (ref 30.0–36.0)
MCHC: 30.9 g/dL (ref 30.0–36.0)
MCV: 82.4 fL (ref 78.0–100.0)
MCV: 83.5 fL (ref 78.0–100.0)
PLATELETS: 333 10*3/uL (ref 150–400)
Platelets: 289 10*3/uL (ref 150–400)
RBC: 3.01 MIL/uL — AB (ref 3.87–5.11)
RBC: 3.22 MIL/uL — AB (ref 3.87–5.11)
RDW: 16.3 % — ABNORMAL HIGH (ref 11.5–15.5)
RDW: 16.5 % — ABNORMAL HIGH (ref 11.5–15.5)
WBC: 14.4 10*3/uL — ABNORMAL HIGH (ref 4.0–10.5)
WBC: 16.2 10*3/uL — AB (ref 4.0–10.5)

## 2017-04-18 LAB — URINALYSIS, ROUTINE W REFLEX MICROSCOPIC
BILIRUBIN URINE: NEGATIVE
Bacteria, UA: NONE SEEN
Glucose, UA: 150 mg/dL — AB
KETONES UR: 5 mg/dL — AB
Nitrite: NEGATIVE
PH: 5 (ref 5.0–8.0)
Protein, ur: 30 mg/dL — AB
Specific Gravity, Urine: 1.029 (ref 1.005–1.030)

## 2017-04-18 LAB — GLUCOSE, CAPILLARY
GLUCOSE-CAPILLARY: 182 mg/dL — AB (ref 65–99)
GLUCOSE-CAPILLARY: 161 mg/dL — AB (ref 65–99)
GLUCOSE-CAPILLARY: 166 mg/dL — AB (ref 65–99)
GLUCOSE-CAPILLARY: 122 mg/dL — AB (ref 65–99)
GLUCOSE-CAPILLARY: 141 mg/dL — AB (ref 65–99)
Glucose-Capillary: 210 mg/dL — ABNORMAL HIGH (ref 65–99)

## 2017-04-18 NOTE — Progress Notes (Signed)
Emily EaringMary Katherine Werner  MRN: [id number] DOB/Age: 81/11/1916 81 y.o. Physician: Aundria Rudogers Procedure: Procedure(s) (LRB): INTRAMEDULLARY (IM) NAIL INTERTROCHANTRIC (Right)     Subjective: Resting comfortably. Family at bedside. Had norco earlier this am after a restless evening  Vital Signs Temp:  [97.4 F (36.3 C)-98.5 F (36.9 C)] 98.5 F (36.9 C) (07/07 0453) Pulse Rate:  [61-93] 93 (07/07 0453) Resp:  [13-20] 16 (07/07 0453) BP: (83-120)/(38-84) 99/48 (07/07 0453) SpO2:  [93 %-100 %] 93 % (07/07 0453)  Lab Results  Recent Labs  04/17/17 0140 04/18/17 0510  WBC 13.0* 14.4*  HGB 8.5* 7.6*  HCT 26.5* 24.8*  PLT 366 333   BMET  Recent Labs  04/17/17 0140 04/18/17 0510  NA 141 137  K 3.5 4.4  CL 106 106  CO2 23 22  GLUCOSE 69 201*  BUN 14 11  CREATININE 1.01* 0.96  CALCIUM 8.8* 7.9*   INR  Date Value Ref Range Status  04/17/2017 1.08  Final     Exam Moves left foot after awakening her Dressing dry        Plan Mobilize later today  [facility] Hospital[facility]FORD,Asmar Brozek PA-C   04/18/2017, 9:53 AM Contact # [phone]

## 2017-04-18 NOTE — Anesthesia Postprocedure Evaluation (Signed)
Anesthesia Post Note  Patient: Mercy Katherine Nies  Procedure(s) Performed: Procedure(s) (LRB): INTRAMEDULLARY (IM) NAIL INTERTROCHANTRIC (Right)     Patient location during evaluation: PACU Anesthesia Type: General Level of consciousness: sedated and patient cooperative Pain management: pain level controlled Vital Signs Assessment: post-procedure vital signs reviewed and stable Respiratory status: spontaneous breathing Cardiovascular status: stable Anesthetic complications: no    Last Vitals:  Vitals:   04/18/17 0033 04/18/17 0453  BP: (!) 105/46 (!) 99/48  Pulse: 89 93  Resp: 20 16  Temp: 36.9 C 36.9 C    Last Pain:  Vitals:   04/18/17 0800  TempSrc:   PainSc: Asleep                 Lewie LoronJohn Lener Ventresca

## 2017-04-18 NOTE — Evaluation (Signed)
Occupational Therapy Evaluation Patient Details Name: Emily Werner MRN: [id number] DOB: 08-Jul-1917 Today's Date: 04/18/2017    History of Present Illness Pt is a 83 yo female, found on ground after mechanical fall in her SNF, c/o R hip pain, s/p R hip IM nail 04/17/17. PMH for advanced Alzheimer's, DM, and UTI.   Clinical Impression   Per RN, pt was living at an ALF PTA. No family present to confirm home level and PLOF. Pt currently requiring Max A for ADLs due to drowsiness and lethargy. Pt requiring Max A +2 for bed mobility and Min A +2 for stand pivot from EOB to recliner. Pt requires simple, direct VCs throughout session to increase participation. Pt would benefit from acute OT to increase occupational performance and participation. Recommend dc to SNF for further OT to increase pt safety and independence with ADLs and functional mobility.     Follow Up Recommendations  SNF;Supervision/Assistance - 24 hour    Equipment Recommendations  Other (comment) (Defer to next venue)    Recommendations for Other Services PT consult     Precautions / Restrictions Precautions Precautions: Fall Restrictions Weight Bearing Restrictions: No      Mobility Bed Mobility Overal bed mobility: Needs Assistance Bed Mobility: Supine to Sit     Supine to sit: Max assist;+2 for physical assistance     General bed mobility comments: maxAx2 for trunk to upright and LE management to floor, pt with excessive posterior push in resistance to pain ellicited by movement. VCs to sit on the EOB aided and pt able to maintain balance with min assist  Transfers Overall transfer level: Needs assistance Equipment used: 2 person hand held assist Transfers: Stand Pivot Transfers   Stand pivot transfers: +2 physical assistance;Min assist       General transfer comment: minAx2 for maintaining upright until infront of chair, with verbal and visual cues pt able to initiate movement to recliner     Balance Overall balance assessment: Needs assistance Sitting-balance support: Feet unsupported;Bilateral upper extremity supported Sitting balance-Leahy Scale: Poor Sitting balance - Comments: required assist at LE to maintain balance Postural control: Posterior lean Standing balance support: Bilateral upper extremity supported Standing balance-Leahy Scale: Zero Standing balance comment: Requiring assistance to maintain standing                           ADL either performed or assessed with clinical judgement   ADL Overall ADL's : Needs assistance/impaired                                       General ADL Comments: Pt lethargic and had difficulty keeping eyes open throughout session. With current lethargy, pt will requiring Max A for ADLs. Attempted grooming at bedlevel; pt would not participate. RN/NT report that pt not participating in grooming feeding due to lethargy. Pt perform stand pivot transfer from EOB to recliner with Min A +2 and simple,direct VCs     Vision         Perception     Praxis      Pertinent Vitals/Pain Pain Assessment: Faces Faces Pain Scale: Hurts even more Pain Location: R hip with movement Pain Descriptors / Indicators: Guarding;Grimacing Pain Intervention(s): Monitored during session;Limited activity within patient's tolerance;Repositioned;Ice applied     Hand Dominance Right   Extremity/Trunk Assessment Upper Extremity Assessment Upper Extremity Assessment: Generalized weakness  Lower Extremity Assessment Lower Extremity Assessment: Defer to PT evaluation RLE Deficits / Details: R hip IM nail, pain limiting strength and ROM   Cervical / Trunk Assessment Cervical / Trunk Assessment: Kyphotic   Communication Communication Communication: Expressive difficulties;Receptive difficulties   Cognition Arousal/Alertness: Lethargic Behavior During Therapy: Flat affect Overall Cognitive Status: No family/caregiver  present to determine baseline cognitive functioning                                     General Comments  pt very sleepy at entry, with limited interaction, responded to painful movement of R LE and participated in movement to chair but quickly returned to sleep SaO2 on 2L O2 via nasal cannula, on RA pt SaO2 dropped to 83% O2. 2L O2 via nasal cannula replaced for movement to recliner    Exercises     Shoulder Instructions      Home Living Family/patient expects to be discharged to:: Assisted living (per RN family not present for clarification)                                        Prior Functioning/Environment          Comments: Family not present to report on pt's PLOF        OT Problem List: Decreased strength;Decreased range of motion;Decreased activity tolerance;Impaired balance (sitting and/or standing);Decreased cognition;Decreased safety awareness;Decreased knowledge of use of DME or AE;Decreased knowledge of precautions;Pain      OT Treatment/Interventions: Self-care/ADL training;Therapeutic exercise;Energy conservation;DME and/or AE instruction;Therapeutic activities;Patient/family education    OT Goals(Current goals can be found in the care plan section) Acute Rehab OT Goals Patient Stated Goal: none stated OT Goal Formulation: With patient Time For Goal Achievement: 05/02/17 Potential to Achieve Goals: Good  OT Frequency: Min 2X/week   Barriers to D/C:            Co-evaluation   Reason for Co-Treatment: Necessary to address cognition/behavior during functional activity PT goals addressed during session: Mobility/safety with mobility        AM-PAC PT "6 Clicks" Daily Activity     Outcome Measure Help from another person eating meals?: A Lot Help from another person taking care of personal grooming?: A Lot Help from another person toileting, which includes using toliet, bedpan, or urinal?: Total Help from another person  bathing (including washing, rinsing, drying)?: Total Help from another person to put on and taking off regular upper body clothing?: A Lot Help from another person to put on and taking off regular lower body clothing?: A Lot 6 Click Score: 10   End of Session Equipment Utilized During Treatment: Gait belt;Oxygen (2L O2) Nurse Communication: Mobility status;Precautions;Weight bearing status  Activity Tolerance: Patient limited by lethargy Patient left: in chair;with call bell/phone within reach;with chair alarm set  OT Visit Diagnosis: Unsteadiness on feet (R26.81);Other abnormalities of gait and mobility (R26.89);Muscle weakness (generalized) (M62.81);Other symptoms and signs involving cognitive function;Pain Pain - Right/Left: Right Pain - part of body: Leg                Time: 1134-1155 OT Time Calculation (min): 21 min Charges:  OT General Charges $OT Visit: 1 Procedure OT Evaluation $OT Eval Moderate Complexity: 1 Procedure G-Codes:     Manahil Vanzile MSOT, OTR/L Acute Rehab Pager: [phone] Office: [phone]  Luan Mooreharis  M Zymir Napoli 04/18/2017, 1:37 PM

## 2017-04-18 NOTE — Evaluation (Signed)
Physical Therapy Evaluation Patient Details Name: Emily Werner MRN: [id number] DOB: 06/21/1917 Today's Date: 04/18/2017   History of Present Illness  Pt is a 83 yo female, found on ground after mechanical fall in her SNF, c/o R hip pain, s/p R hip IM nail 04/17/17. PMH for advanced Alzheimer's, DM, and UTI.  Clinical Impression  Patient is s/p above surgery resulting in functional limitations due to the deficits listed below (see PT Problem List). Pt is maxAx2 for bed mobility and modAx2 for stand pivot transfer to recliner. Pt requires simple verbal cues for mobility. Patient will benefit from skilled PT to increase their independence and safety with mobility to allow discharge to the venue listed below.       Follow Up Recommendations SNF    Equipment Recommendations  None recommended by PT    Recommendations for Other Services OT consult     Precautions / Restrictions Precautions Precautions: Fall Restrictions Weight Bearing Restrictions: No      Mobility  Bed Mobility Overal bed mobility: Needs Assistance Bed Mobility: Supine to Sit     Supine to sit: Max assist;+2 for physical assistance     General bed mobility comments: maxAx2 for trunk to upright and LE management to floor, pt with excessive posterior push in resistance to pain ellicited by movement. vc to sit on the EoB aided adn pt able to maintain balance with min assist  Transfers Overall transfer level: Needs assistance Equipment used: 2 person hand held assist Transfers: Stand Pivot Transfers   Stand pivot transfers: +2 physical assistance;Min assist       General transfer comment: minAx2 for maintaining upright until infront of chair, with verbal and visual cues pt able to initiate movement to recliner      Balance Overall balance assessment: Needs assistance Sitting-balance support: Feet unsupported;Bilateral upper extremity supported Sitting balance-Leahy Scale: Poor Sitting balance -  Comments: required assist at LE to maintain balance Postural control: Posterior lean Standing balance support: Bilateral upper extremity supported Standing balance-Leahy Scale: Zero Standing balance comment: requires modAx2 for upright                             Pertinent Vitals/Pain Pain Assessment: Faces Faces Pain Scale: Hurts even more Pain Location: R hip with movement Pain Descriptors / Indicators: Guarding;Grimacing Pain Intervention(s): Monitored during session;Repositioned  VSS    Home Living Family/patient expects to be discharged to:: Assisted living (per RN family not present for clarification)                       Extremity/Trunk Assessment   Upper Extremity Assessment Upper Extremity Assessment: Defer to OT evaluation    Lower Extremity Assessment Lower Extremity Assessment: RLE deficits/detail RLE Deficits / Details: R hip IM nail, pain limiting strength and ROM    Cervical / Trunk Assessment Cervical / Trunk Assessment: Kyphotic  Communication   Communication: Expressive difficulties;Receptive difficulties  Cognition Arousal/Alertness: Lethargic Behavior During Therapy: Flat affect Overall Cognitive Status: No family/caregiver present to determine baseline cognitive functioning                                        General Comments General comments (skin integrity, edema, etc.): pt very sleepy at entry, with limited interaction, responded to painful movement of R LE and participated in movement to chair but  quickly returned to sleep SaO2 on 2L O2 via nasal cannula, on RA pt SaO2 dropped to 83% O2. 2L O2 via nasal cannula replaced for movement to recliner        Assessment/Plan    PT Assessment Patient needs continued PT services  PT Problem List Decreased strength;Decreased range of motion;Decreased activity tolerance;Decreased balance;Decreased mobility;Decreased knowledge of use of DME;Decreased safety  awareness;Decreased cognition;Pain       PT Treatment Interventions DME instruction;Gait training;Functional mobility training;Therapeutic activities;Therapeutic exercise;Balance training;Cognitive remediation;Patient/family education    PT Goals (Current goals can be found in the Care Plan section)  Acute Rehab PT Goals Patient Stated Goal: none stated PT Goal Formulation: Patient unable to participate in goal setting Time For Goal Achievement: 05/02/17 Potential to Achieve Goals: Fair    Frequency Min 2X/week        Co-evaluation PT/OT/SLP Co-Evaluation/Treatment: Yes Reason for Co-Treatment: Necessary to address cognition/behavior during functional activity PT goals addressed during session: Mobility/safety with mobility         AM-PAC PT "6 Clicks" Daily Activity  Outcome Measure Difficulty turning over in bed (including adjusting bedclothes, sheets and blankets)?: Total Difficulty moving from lying on back to sitting on the side of the bed? : Total Difficulty sitting down on and standing up from a chair with arms (e.g., wheelchair, bedside commode, etc,.)?: Total Help needed moving to and from a bed to chair (including a wheelchair)?: Total Help needed walking in hospital room?: Total Help needed climbing 3-5 steps with a railing? : Total 6 Click Score: 6    End of Session Equipment Utilized During Treatment: Gait belt;Oxygen Activity Tolerance: Patient limited by lethargy;Patient limited by pain Patient left: in chair;with call bell/phone within reach;with chair alarm set Nurse Communication: Mobility status PT Visit Diagnosis: Unsteadiness on feet (R26.81);Other abnormalities of gait and mobility (R26.89);History of falling (Z91.81);Muscle weakness (generalized) (M62.81);Difficulty in walking, not elsewhere classified (R26.2);Pain Pain - Right/Left: Right Pain - part of body: Hip    Time: [id number] PT Time Calculation (min) (ACUTE ONLY): 25 min   Charges:   PT  Evaluation $PT Eval Low Complexity: 1 Procedure     PT G Codes:        Bricen Victory B. Beverely RisenVan Fleet PT, DPT Acute Rehabilitation  [phone] Pager [phone]    Elon Alaslizabeth B Van Fleet 04/18/2017, 1:21 PM

## 2017-04-18 NOTE — Progress Notes (Addendum)
Patient ID: Amarah Brossman, female   DOB: [birth year], 83 y.o.   MRN: [id number]    PROGRESS NOTE    Torra Pala  WGN:[id number] DOB: 11/12/1916 DOA: 04/16/2017  PCP: Patient, No Pcp Per   Brief Narrative:  Pt is 83 yo female with known advanced dementia, admitted after an episode of fall and was found to have sustained right femur fracture.   Assessment & Plan:   Principal Problem:   Fracture - right femur fracture - s/p IM nailing, post op day #1, pt more sleepy this am but got dose of narcotic pain medication - PT eval done, will need SNF placement   Active Problems:   Alzheimer's dementia - advanced - plan for SNF placement     Post op blood loss anemia - drop in Hg but no evidence of active bleeding - repeat CBC again today and transfuse if Hg < 7 - CBC in AM as well     Leukocytosis - reactive  - no evidence of an infectious etiology - CBC in AM    DM type II - on glimepiride at home - held here and placed on SSI   DVT prophylaxis: SCD's Code Status: DNR Family Communication: Son at bedside  Disposition Plan: SNF once medically stable   Consultants:   Ortho   Procedures:   Right IM nail, intertrochanteric 7/6  Antimicrobials:   Pre op ABX only    Subjective: Pt more sleepy this AM.  Objective: Vitals:   04/17/17 1940 04/17/17 2009 04/18/17 0033 04/18/17 0453  BP: 105/84 (!) 94/42 (!) 105/46 (!) 99/48  Pulse: 70 64 89 93  Resp: 13 20 20 16   Temp: (!) 97.5 F (36.4 C) (!) 97.5 F (36.4 C) 98.4 F (36.9 C) 98.5 F (36.9 C)  TempSrc:  Axillary Axillary Axillary  SpO2: 100% 97% 95% 93%  Weight:      Height:        Intake/Output Summary (Last 24 hours) at 04/18/17 1418 Last data filed at 04/18/17 1132  Gross per 24 hour  Intake             1700 ml  Output              750 ml  Net              950 ml   Filed Weights   04/16/17 2338  Weight: 78.5 kg (173 lb)    Examination:  General exam: Appears sleepy.  NAD Respiratory system: Respiratory effort normal. Cardiovascular system: S1 & S2 heard, RRR. No JVD, rubs, gallops or clicks. Gastrointestinal system: Abdomen is nondistended, soft and nontender. No organomegaly or masses  Central nervous system: somnolent but easy to awake, moving left LE when awake   Data Reviewed: I have personally reviewed following labs and imaging studies  CBC:  Recent Labs Lab 04/17/17 0140 04/18/17 0510  WBC 13.0* 14.4*  NEUTROABS 8.6*  --   HGB 8.5* 7.6*  HCT 26.5* 24.8*  MCV 81.0 82.4  PLT 366 333   Basic Metabolic Panel:  Recent Labs Lab 04/17/17 0140 04/18/17 0510  NA 141 137  K 3.5 4.4  CL 106 106  CO2 23 22  GLUCOSE 69 201*  BUN 14 11  CREATININE 1.01* 0.96  CALCIUM 8.8* 7.9*   Liver Function Tests:  Recent Labs Lab 04/17/17 0140  AST 19  ALT 15  ALKPHOS 148*  BILITOT 0.6  PROT 6.6  ALBUMIN 3.4*   Coagulation Profile:  Recent Labs Lab 04/17/17 0637  INR 1.08   CBG:  Recent Labs Lab 04/17/17 2009 04/18/17 0031 04/18/17 0451 04/18/17 0807 04/18/17 1143  GLUCAP 128* 210* 182* 161* 166*   Urine analysis:    Component Value Date/Time   COLORURINE YELLOW 04/18/2017 1345   APPEARANCEUR HAZY (A) 04/18/2017 1345   LABSPEC 1.029 04/18/2017 1345   PHURINE 5.0 04/18/2017 1345   GLUCOSEU 150 (A) 04/18/2017 1345   HGBUR SMALL (A) 04/18/2017 1345   BILIRUBINUR NEGATIVE 04/18/2017 1345   KETONESUR 5 (A) 04/18/2017 1345   PROTEINUR 30 (A) 04/18/2017 1345   UROBILINOGEN 0.2 11/25/2011 1021   NITRITE NEGATIVE 04/18/2017 1345   LEUKOCYTESUR TRACE (A) 04/18/2017 1345   Recent Results (from the past 240 hour(s))  MRSA PCR Screening     Status: None   Collection Time: 04/17/17  5:35 AM  Result Value Ref Range Status   MRSA by PCR NEGATIVE NEGATIVE Final    Comment:        The GeneXpert MRSA Assay (FDA approved for NASAL specimens only), is one component of a comprehensive MRSA colonization surveillance program. It is  not intended to diagnose MRSA infection nor to guide or monitor treatment for MRSA infections.   Surgical pcr screen     Status: Abnormal   Collection Time: 04/17/17  2:35 PM  Result Value Ref Range Status   MRSA, PCR NEGATIVE NEGATIVE Final   Staphylococcus aureus POSITIVE (A) NEGATIVE Final    Comment:        The Xpert SA Assay (FDA approved for NASAL specimens in patients over 76 years of age), is one component of a comprehensive surveillance program.  Test performance has been validated by [facility] for patients greater than or equal to 96 year old. It is not intended to diagnose infection nor to guide or monitor treatment.       Radiology Studies: Dg Chest 1 View  Result Date: 04/17/2017 CLINICAL DATA:  Status post fall on right hip, with concern for chest injury. Initial encounter. EXAM: CHEST 1 VIEW COMPARISON:  Chest radiograph performed 03/29/2017 FINDINGS: The lungs are well-aerated. There is elevation of the right hemidiaphragm, with right basilar atelectasis. There is no evidence of pleural effusion or pneumothorax. The cardiomediastinal silhouette is borderline normal in size. No acute osseous abnormalities are seen. IMPRESSION: Elevation of the right hemidiaphragm, with right basilar atelectasis. No displaced rib fractures identified. Electronically Signed   By: Roanna Raider M.D.   On: 04/17/2017 01:08   Dg C-arm 1-60 Min  Result Date: 04/17/2017 CLINICAL DATA:  83 year old female with a history of femur fracture EXAM: RIGHT FEMUR 2 VIEWS; DG C-ARM 61-120 MIN COMPARISON:  04/17/2017 FINDINGS: Intraoperative fluoroscopic spot images during open reduction internal fixation of right femoral neck fracture. Images demonstrate antegrade intramedullary rod with gamma nail fixation at the femoral head and distal interlocking screws. No immediate complicating features. IMPRESSION: Limited intraoperative fluoroscopic spot images of open reduction internal fixation of right  femoral neck fracture. Please refer to the dictated operative report for full details of intraoperative findings and procedure. Electronically Signed   By: Gilmer Mor D.O.   On: 04/17/2017 18:35   Dg Hip Unilat W Or Wo Pelvis 2-3 Views Right  Result Date: 04/17/2017 CLINICAL DATA:  Status post fall on right hip, with right hip pain. Initial encounter. EXAM: DG HIP (WITH OR WITHOUT PELVIS) 2-3V RIGHT COMPARISON:  None. FINDINGS: There is a displaced right femoral intertrochanteric fracture. No additional fractures are seen. The  right femoral head remains seated at the acetabulum. The left hip joint is grossly unremarkable. There is mild chronic deformity of the left inferior pubic ramus. The sacroiliac joints demonstrate mild sclerosis. The visualized bowel gas pattern is grossly unremarkable. No definite soft tissue abnormalities are characterized on radiograph. IMPRESSION: Displaced right femoral intertrochanteric fracture. Electronically Signed   By: Roanna RaiderJeffery  Chang M.D.   On: 04/17/2017 01:07   Dg Femur, Min 2 Views Right  Result Date: 04/17/2017 CLINICAL DATA:  83 year old female with a history of femur fracture EXAM: RIGHT FEMUR 2 VIEWS; DG C-ARM 61-120 MIN COMPARISON:  04/17/2017 FINDINGS: Intraoperative fluoroscopic spot images during open reduction internal fixation of right femoral neck fracture. Images demonstrate antegrade intramedullary rod with gamma nail fixation at the femoral head and distal interlocking screws. No immediate complicating features. IMPRESSION: Limited intraoperative fluoroscopic spot images of open reduction internal fixation of right femoral neck fracture. Please refer to the dictated operative report for full details of intraoperative findings and procedure. Electronically Signed   By: Gilmer MorJaime  Wagner D.O.   On: 04/17/2017 18:35   Dg Femur, Min 2 Views Right  Result Date: 04/17/2017 CLINICAL DATA:  Status post fall, with right hip pain. Initial encounter. EXAM: RIGHT FEMUR  2 VIEWS COMPARISON:  None. FINDINGS: There is a mildly displaced intertrochanteric fracture through the proximal right femur. No additional fractures are seen. The right femoral head remains seated at the acetabulum. The knee joint is grossly unremarkable. No knee joint effusion is identified. No definite soft tissue abnormalities are characterized on radiograph. IMPRESSION: Mildly displaced intertrochanteric fracture through the proximal right femur. Electronically Signed   By: Roanna RaiderJeffery  Chang M.D.   On: 04/17/2017 01:05   Dg Femur Port, Min 2 Views Right  Result Date: 04/17/2017 CLINICAL DATA:  Postop intertrochanteric nail fixation of the right hip. EXAM: RIGHT FEMUR PORTABLE 2 VIEW COMPARISON:  None. FINDINGS: There is an intertrochanteric fracture of the right femur traversed by long-stem intramedullary nail fixation. No postoperative complications are noted. Postop soft tissue changes with subcutaneous emphysema and skin staples are seen overlying the right hip. The femoral head is seated within the right hip joint. IMPRESSION: Status post IM nail fixation of a right intertrochanteric fracture of the right femur without immediate complication noted. Electronically Signed   By: Tollie Ethavid  Kwon M.D.   On: 04/17/2017 19:34   Scheduled Meds: . docusate sodium  100 mg Oral BID  . insulin aspart  0-9 Units Subcutaneous Q4H  . mirtazapine  15 mg Oral QHS   Continuous Infusions: . sodium chloride 75 mL/hr at 04/18/17 0003  . lactated ringers    . methocarbamol (ROBAXIN)  IV       LOS: 1 day   Time spent: 25 minutes   Debbora PrestoIskra Magick-Heatherly Stenner, MD [facility] Pager [phone]  If 7PM-7AM, please contact night-coverage [email or web address] Password [email or web address] 04/18/2017, 2:18 PM

## 2017-04-18 NOTE — Social Work (Addendum)
[facility]  CSW started FL2, however passr pending and no OT/PT evaluation completed yet.   CSW will continue to follow for appropriate dc.  Keene BreathPatricia Toniesha Zellner, LCSW Clinical Social Worker [phone]

## 2017-04-19 ENCOUNTER — Encounter (HOSPITAL_COMMUNITY): Payer: Self-pay | Admitting: *Deleted

## 2017-04-19 LAB — BASIC METABOLIC PANEL
Anion gap: 5 (ref 5–15)
BUN: 12 mg/dL (ref 6–20)
CALCIUM: 8 mg/dL — AB (ref 8.9–10.3)
CO2: 26 mmol/L (ref 22–32)
CREATININE: 0.89 mg/dL (ref 0.44–1.00)
Chloride: 109 mmol/L (ref 101–111)
GFR, EST NON AFRICAN AMERICAN: 53 mL/min — AB (ref >60)
Glucose, Bld: 104 mg/dL — ABNORMAL HIGH (ref 65–99)
Potassium: 3.7 mmol/L (ref 3.5–5.1)
SODIUM: 140 mmol/L (ref 135–145)

## 2017-04-19 LAB — CBC
HEMATOCRIT: 22.4 % — AB (ref 36.0–46.0)
Hemoglobin: 6.7 g/dL — CL (ref 12.0–15.0)
MCH: 25.5 pg — ABNORMAL LOW (ref 26.0–34.0)
MCHC: 30.4 g/dL (ref 30.0–36.0)
MCV: 83.9 fL (ref 78.0–100.0)
PLATELETS: 292 10*3/uL (ref 150–400)
RBC: 2.67 MIL/uL — ABNORMAL LOW (ref 3.87–5.11)
RDW: 16.8 % — AB (ref 11.5–15.5)
WBC: 10.8 10*3/uL — ABNORMAL HIGH (ref 4.0–10.5)

## 2017-04-19 LAB — PREPARE RBC (CROSSMATCH)

## 2017-04-19 LAB — GLUCOSE, CAPILLARY
GLUCOSE-CAPILLARY: 88 mg/dL (ref 65–99)
GLUCOSE-CAPILLARY: 160 mg/dL — AB (ref 65–99)
Glucose-Capillary: 117 mg/dL — ABNORMAL HIGH (ref 65–99)
Glucose-Capillary: 145 mg/dL — ABNORMAL HIGH (ref 65–99)
Glucose-Capillary: 157 mg/dL — ABNORMAL HIGH (ref 65–99)

## 2017-04-19 MED ORDER — SODIUM CHLORIDE 0.9 % IV SOLN: Freq: Once | INTRAVENOUS | Status: DC

## 2017-04-19 NOTE — Progress Notes (Signed)
Patient ID: Emily Werner, female   DOB: [birth year], 83 y.o.   MRN: [id number]    PROGRESS NOTE   [place]  WUJ:[id number] DOB: [birth year] DOA: 04/16/2017  PCP: Patient, No Pcp Per   Brief Narrative:  Pt is 83 yo female with known advanced dementia, admitted after an episode of fall and was found to have sustained right femur fracture.   Assessment & Plan:   Principal Problem:   Fracture - right femur fracture - s/p IM nailing, post op day #2 - pt still somewhat sleepy this AM  - PT eval done, will need SNF placement   Active Problems:   Alzheimer's dementia - advanced - plan d/c SNF when medically more stable and blood counts stable     Post op blood loss anemia - drop in Hg post op - will transfuse two U PRBC today  - CBC in AM    Leukocytosis - reactive  - no evidence of an infectious etiology - trending down overall - CBC In AM    DM type II - on glimepiride at home - keep on SSI   DVT prophylaxis: SCD's Code Status: DNR Family Communication: no family at bedside but they have my cell phone number to reach me for update  Disposition Plan: SNF once medically stable   Consultants:   Ortho   Procedures:   Right IM nail, intertrochanteric 7/6  Antimicrobials:   Pre op ABX only    Subjective: Pt more sleepy this AM.   Objective: Vitals:   04/19/17 1005 04/19/17 1134 04/19/17 1206 04/19/17 1216  BP: (!) 96/39 (!) 104/59 (!) 102/41 (!) 111/52  Pulse: 63 67 70 83  Resp: 16 18 16 16   Temp: (!) 97.5 F (36.4 C) (!) 97.4 F (36.3 C) (!) 97.4 F (36.3 C) 97.6 F (36.4 C)  TempSrc: Axillary Axillary Axillary Axillary  SpO2: 92% 92% 92% 91%  Weight:      Height:        Intake/Output Summary (Last 24 hours) at 04/19/17 1241 Last data filed at 04/19/17 1134  Gross per 24 hour  Intake              505 ml  Output              515 ml  Net              -10 ml   Filed Weights   04/16/17 2338  Weight: 78.5 kg (173 lb)    Physical  Exam  Constitutional: Appears somnolent but easy to awake CVS: RRR, S1/S2 +, no gallops, no carotid bruit.  Pulmonary: Effort and breath sounds normal  Abdominal: Soft. BS +,  no distension, tenderness, rebound or guarding.   Data Reviewed: I have personally reviewed following labs and imaging studies  CBC:  Recent Labs Lab 04/17/17 0140 04/18/17 0510 04/18/17 1446 04/19/17 0404  WBC 13.0* 14.4* 16.2* 10.8*  NEUTROABS 8.6*  --   --   --   HGB 8.5* 7.6* 8.3* 6.7*  HCT 26.5* 24.8* 26.9* 22.4*  MCV 81.0 82.4 83.5 83.9  PLT 366 333 289 292   Basic Metabolic Panel:  Recent Labs Lab 04/17/17 0140 04/18/17 0510 04/19/17 0404  NA 141 137 140  K 3.5 4.4 3.7  CL 106 106 109  CO2 23 22 26   GLUCOSE 69 201* 104*  BUN 14 11 12   CREATININE 1.01* 0.96 0.89  CALCIUM 8.8* 7.9* 8.0*   Liver Function Tests:  Recent  Labs Lab 04/17/17 0140  AST 19  ALT 15  ALKPHOS 148*  BILITOT 0.6  PROT 6.6  ALBUMIN 3.4*   Coagulation Profile:  Recent Labs Lab 04/17/17 0637  INR 1.08   CBG:  Recent Labs Lab 04/18/17 1643 04/18/17 2051 04/19/17 0006 04/19/17 0433 04/19/17 1227  GLUCAP 122* 141* 145* 88 117*   Urine analysis:    Component Value Date/Time   COLORURINE YELLOW 04/18/2017 1345   APPEARANCEUR HAZY (A) 04/18/2017 1345   LABSPEC 1.029 04/18/2017 1345   PHURINE 5.0 04/18/2017 1345   GLUCOSEU 150 (A) 04/18/2017 1345   HGBUR SMALL (A) 04/18/2017 1345   BILIRUBINUR NEGATIVE 04/18/2017 1345   KETONESUR 5 (A) 04/18/2017 1345   PROTEINUR 30 (A) 04/18/2017 1345   UROBILINOGEN 0.2 11/25/2011 1021   NITRITE NEGATIVE 04/18/2017 1345   LEUKOCYTESUR TRACE (A) 04/18/2017 1345   Recent Results (from the past 240 hour(s))  MRSA PCR Screening     Status: None   Collection Time: 04/17/17  5:35 AM  Result Value Ref Range Status   MRSA by PCR NEGATIVE NEGATIVE Final    Comment:        The GeneXpert MRSA Assay (FDA approved for NASAL specimens only), is one component of  a comprehensive MRSA colonization surveillance program. It is not intended to diagnose MRSA infection nor to guide or monitor treatment for MRSA infections.   Surgical pcr screen     Status: Abnormal   Collection Time: 04/17/17  2:35 PM  Result Value Ref Range Status   MRSA, PCR NEGATIVE NEGATIVE Final   Staphylococcus aureus POSITIVE (A) NEGATIVE Final    Comment:        The Xpert SA Assay (FDA approved for NASAL specimens in patients over 48 years of age), is one component of a comprehensive surveillance program.  Test performance has been validated by [facility] for patients greater than or equal to 31 year old. It is not intended to diagnose infection nor to guide or monitor treatment.       Radiology Studies: Dg C-arm 1-60 Min  Result Date: 04/17/2017 CLINICAL DATA:  83 year old female with a history of femur fracture EXAM: RIGHT FEMUR 2 VIEWS; DG C-ARM 61-120 MIN COMPARISON:  04/17/2017 FINDINGS: Intraoperative fluoroscopic spot images during open reduction internal fixation of right femoral neck fracture. Images demonstrate antegrade intramedullary rod with gamma nail fixation at the femoral head and distal interlocking screws. No immediate complicating features. IMPRESSION: Limited intraoperative fluoroscopic spot images of open reduction internal fixation of right femoral neck fracture. Please refer to the dictated operative report for full details of intraoperative findings and procedure. Electronically Signed   By: Gilmer Mor D.O.   On: 04/17/2017 18:35   Dg Femur, Min 2 Views Right  Result Date: 04/17/2017 CLINICAL DATA:  83 year old female with a history of femur fracture EXAM: RIGHT FEMUR 2 VIEWS; DG C-ARM 61-120 MIN COMPARISON:  04/17/2017 FINDINGS: Intraoperative fluoroscopic spot images during open reduction internal fixation of right femoral neck fracture. Images demonstrate antegrade intramedullary rod with gamma nail fixation at the femoral head and distal  interlocking screws. No immediate complicating features. IMPRESSION: Limited intraoperative fluoroscopic spot images of open reduction internal fixation of right femoral neck fracture. Please refer to the dictated operative report for full details of intraoperative findings and procedure. Electronically Signed   By: Gilmer Mor D.O.   On: 04/17/2017 18:35   Dg Femur Port, Min 2 Views Right  Result Date: 04/17/2017 CLINICAL DATA:  Postop intertrochanteric nail  fixation of the right hip. EXAM: RIGHT FEMUR PORTABLE 2 VIEW COMPARISON:  None. FINDINGS: There is an intertrochanteric fracture of the right femur traversed by long-stem intramedullary nail fixation. No postoperative complications are noted. Postop soft tissue changes with subcutaneous emphysema and skin staples are seen overlying the right hip. The femoral head is seated within the right hip joint. IMPRESSION: Status post IM nail fixation of a right intertrochanteric fracture of the right femur without immediate complication noted. Electronically Signed   By: Tollie Ethavid  Kwon M.D.   On: 04/17/2017 19:34   Scheduled Meds: . docusate sodium  100 mg Oral BID  . insulin aspart  0-9 Units Subcutaneous Q4H  . mirtazapine  15 mg Oral QHS   Continuous Infusions: . sodium chloride 75 mL/hr at 04/18/17 1945  . sodium chloride    . lactated ringers    . methocarbamol (ROBAXIN)  IV       LOS: 2 days   Time spent: 25 minutes   Debbora PrestoIskra Magick-Trevione Wert, MD [facility] Pager [phone]  If 7PM-7AM, please contact night-coverage [email or web address] Password [email or web address] 04/19/2017, 12:41 PM

## 2017-04-19 NOTE — Progress Notes (Signed)
Patient has not voided after urinary catheter removal for at least 6 hours. Bladder scan showed 300 ml of urine. Lower abdomen is tender to touch. In and out cath yielded 315 ml of urine.

## 2017-04-19 NOTE — Progress Notes (Signed)
Subjective: 2 Days Post-Op Procedure(s) (LRB): INTRAMEDULLARY (IM) NAIL INTERTROCHANTRIC (Right) Patient reports pain as moderate to right hip. No new complaints. Family at bedside.     Objective: Vital signs in last 24 hours: Temp:  [97.4 F (36.3 C)-98.3 F (36.8 C)] 97.4 F (36.3 C) (07/08 1134) Pulse Rate:  [63-77] 67 (07/08 1134) Resp:  [16-18] 18 (07/08 1134) BP: (95-118)/(39-83) 104/59 (07/08 1134) SpO2:  [90 %-95 %] 92 % (07/08 1134)  Intake/Output from previous day: 07/07 0701 - 07/08 0700 In: 100 [P.O.:50; IV Piggyback:50] Out: 515 [Urine:515] Intake/Output this shift: Total I/O In: 455 [P.O.:120; Blood:335] Out: -    Recent Labs  04/17/17 0140 04/18/17 0510 04/18/17 1446 04/19/17 0404  HGB 8.5* 7.6* 8.3* 6.7*    Recent Labs  04/18/17 1446 04/19/17 0404  WBC 16.2* 10.8*  RBC 3.22* 2.67*  HCT 26.9* 22.4*  PLT 289 292    Recent Labs  04/18/17 0510 04/19/17 0404  NA 137 140  K 4.4 3.7  CL 106 109  CO2 22 26  BUN 11 12  CREATININE 0.96 0.89  GLUCOSE 201* 104*  CALCIUM 7.9* 8.0*    Recent Labs  04/17/17 0637  INR 1.08    Well nourished. Alert and oriented x3. RRR, Lungs clear, BS x4. Abdomen soft and non tender. Right Calf soft and non tender. Right hip dressing C/D/I. No DVT signs. Compartment soft. No signs of infection.  Right LE grossly neurovascular intact.  Assessment/Plan: 2 Days Post-Op Procedure(s) (LRB): INTRAMEDULLARY (IM) NAIL INTERTROCHANTRIC (Right) PT today  Possible SNf at d/c WBAT Bid ASA   STILWELL, BRYSON L 04/19/2017, 11:56 AM

## 2017-04-19 NOTE — Clinical Social Work Note (Signed)
Clinical Social Work Assessment  Patient Details  Name: Emily EaringMary Katherine Budlong MRN: [id number] Date of Birth: 07/01/1917  Date of referral:  04/19/17               Reason for consult:  Facility Placement, Discharge Planning, [employer] Resources                Permission sought to share information with:  Case Production designer, theatre/television/filmManager, Magazine features editoracility Contact Representative, Family Supports Permission granted to share information::  Yes, Verbal Permission Granted  Name::        Agency::  [facility] (ALF/memory care facility)  Relationship::  Daughter: Eber JonesCarolyn:  [phone], home or [phone], cell)  brother: mike:  [phone]  Contact Information:     Housing/Transportation Living arrangements for the past 2 months:  Assisted Living Facility Source of Information:  Medical Team, Case Manager, Adult Children, Facility Patient Interpreter Needed:  None Criminal Activity/Legal Involvement Pertinent to Current Situation/Hospitalization:  No - Comment as needed Significant Relationships:  Adult Children, Other Family Members, Community Support Lives with:  Facility Resident Do you feel safe going back to the place where you live?  Yes Need for family participation in patient care:  Yes (Comment)  Care giving concerns:  Patient admitted after a fall at ALF resulting in a hip fracture requiring surgery.  Patient has been a long term resident at ALF: [facility] for 5 years until the [facility] and [place] [facility] [place].  Daughter reports she would like patient to return to [facility] as she is familiar with staff, close by 2 minutes away and reports she has spoken with Olegario MessierKathy Sales promotion account executive(director) that patient can return with PT 3x a week. Daughter reports strong family support of her brother, daughter in law, and other members to help with assisting patient and checking on her in ALF.  Daughter is aware of SNF recommendation in which LCSW  reviewed and provided.  Daughter is familiar with placement and is hoping for return to ALF if at all possible.    Updated RN for report and will awaiting confirmation on Monday as daughter will follow up with Director after 9am for plans.    Social Worker assessment / plan:  Consult and assessment completed.   Plan:  Return to ALF if ALF approves, daughter going to facility on Monday. LCSW will follow up with call to understand PT at facility, orders and contact agency to assist.   Will follow up Monday. SNF Declined at the current time.  Employment status:  Retired Health and safety inspectornsurance information:  Armed forces operational officerMedicare, [employer]icaid In [employer] PT Recommendations:  Skilled Nursing Facility Information / Referral to community resources:  Skilled Nursing Facility  Patient/Family's Response to care:  Understanding of plan  Patient/Family's Understanding of and Emotional Response to Diagnosis, Current Treatment, and Prognosis:  Daughter voices frustrations with patient crying and limited ability to help ease her pain.  She is understanding of all recommendations and has conversed with family and decided treatment to continue at ALF.  Emotional Assessment Appearance:  Appears stated age Attitude/Demeanor/Rapport:  Complaining, Crying Affect (typically observed):  Agitated, Tearful/Crying Orientation:  Fluctuating Orientation (Suspected and/or reported Sundowners) Alcohol / Substance use:  Not Applicable Psych involvement (Current and /or in the community):  No (Comment)  Discharge Needs  Concerns to be addressed:  Care Coordination, Adjustment to Illness Readmission within the last 30 days:  No Current discharge risk:  None Barriers to Discharge:  Continued Medical Work up  Raye Sorrow, LCSW 04/19/2017, 11:38 AM

## 2017-04-19 NOTE — Progress Notes (Signed)
Lab notify  RN of  Patient's low Hemoglobin level of 6.7. RN notified MD on call. Order received for blood transfusion. [facility]

## 2017-04-19 NOTE — Progress Notes (Signed)
Subjective: 2 Days Post-Op Procedure(s) (LRB): INTRAMEDULLARY (IM) NAIL INTERTROCHANTRIC (Right) Patient not reliable historian, but No new complaints. Family at bedside.     Objective: Vital signs in last 24 hours: Temp:  [97.4 F (36.3 C)-98.8 F (37.1 C)] 98.8 F (37.1 C) (07/08 2041) Pulse Rate:  [63-83] 82 (07/08 2041) Resp:  [16-18] 16 (07/08 1412) BP: (95-117)/(39-59) 117/45 (07/08 2041) SpO2:  [91 %-95 %] 95 % (07/08 2041)  Intake/Output from previous day: 07/07 0701 - 07/08 0700 In: 100 [P.O.:50; IV Piggyback:50] Out: 515 [Urine:515] Intake/Output this shift: No intake/output data recorded.   Recent Labs  04/17/17 0140 04/18/17 0510 04/18/17 1446 04/19/17 0404  HGB 8.5* 7.6* 8.3* 6.7*    Recent Labs  04/18/17 1446 04/19/17 0404  WBC 16.2* 10.8*  RBC 3.22* 2.67*  HCT 26.9* 22.4*  PLT 289 292    Recent Labs  04/18/17 0510 04/19/17 0404  NA 137 140  K 4.4 3.7  CL 106 109  CO2 22 26  BUN 11 12  CREATININE 0.96 0.89  GLUCOSE 201* 104*  CALCIUM 7.9* 8.0*    Recent Labs  04/17/17 0637  INR 1.08    Well nourished. Alert.  Abdomen soft and non tender. Right Calf soft and non tender. Right hip dressing C/D/I. No DVT signs. Compartment soft. No signs of infection.  Right LE grossly neurovascular intact.  Assessment/Plan: 2 Days Post-Op Procedure(s) (LRB): INTRAMEDULLARY (IM) NAIL INTERTROCHANTRIC (Right) PT today  Possible SNf at d/c WBAT RLE Bid ASA and SCDs for DVT ppx   Yolonda KidaJason Patrick Rogers 04/19/2017, 9:32 PM

## 2017-04-20 ENCOUNTER — Encounter (HOSPITAL_COMMUNITY): Payer: Self-pay | Admitting: Orthopedic Surgery

## 2017-04-20 LAB — BPAM RBC
BLOOD PRODUCT EXPIRATION DATE: 201807172359
Blood Product Expiration Date: 201807182359
ISSUE DATE / TIME: 201807080936
ISSUE DATE / TIME: 201807081155
UNIT TYPE AND RH: 600
Unit Type and Rh: 600

## 2017-04-20 LAB — TYPE AND SCREEN
ABO/RH(D): A NEG
Antibody Screen: NEGATIVE
UNIT DIVISION: 0
Unit division: 0

## 2017-04-20 LAB — CBC
HEMATOCRIT: 34.1 % — AB (ref 36.0–46.0)
HEMOGLOBIN: 11.2 g/dL — AB (ref 12.0–15.0)
MCH: 27.8 pg (ref 26.0–34.0)
MCHC: 32.8 g/dL (ref 30.0–36.0)
MCV: 84.6 fL (ref 78.0–100.0)
PLATELETS: 232 10*3/uL (ref 150–400)
RBC: 4.03 MIL/uL (ref 3.87–5.11)
RDW: 16.4 % — ABNORMAL HIGH (ref 11.5–15.5)
WBC: 13.2 10*3/uL — AB (ref 4.0–10.5)

## 2017-04-20 LAB — BASIC METABOLIC PANEL
ANION GAP: 6 (ref 5–15)
BUN: 11 mg/dL (ref 6–20)
CHLORIDE: 110 mmol/L (ref 101–111)
CO2: 23 mmol/L (ref 22–32)
Calcium: 8 mg/dL — ABNORMAL LOW (ref 8.9–10.3)
Creatinine, Ser: 0.73 mg/dL (ref 0.44–1.00)
GFR calc Af Amer: >60 mL/min (ref >60)
GLUCOSE: 101 mg/dL — AB (ref 65–99)
POTASSIUM: 4.2 mmol/L (ref 3.5–5.1)
Sodium: 139 mmol/L (ref 135–145)

## 2017-04-20 LAB — GLUCOSE, CAPILLARY
GLUCOSE-CAPILLARY: 103 mg/dL — AB (ref 65–99)
GLUCOSE-CAPILLARY: 117 mg/dL — AB (ref 65–99)
GLUCOSE-CAPILLARY: 134 mg/dL — AB (ref 65–99)
GLUCOSE-CAPILLARY: 144 mg/dL — AB (ref 65–99)
GLUCOSE-CAPILLARY: 178 mg/dL — AB (ref 65–99)
Glucose-Capillary: 133 mg/dL — ABNORMAL HIGH (ref 65–99)
Glucose-Capillary: 210 mg/dL — ABNORMAL HIGH (ref 65–99)

## 2017-04-20 MED ORDER — MUPIROCIN 2 % EX OINT
1.0000 "application " | TOPICAL_OINTMENT | Freq: Two times a day (BID) | CUTANEOUS | Status: DC
  Administered 2017-04-20 – 2017-04-21 (×3): 1 via NASAL
  Filled 2017-04-20: qty 22

## 2017-04-20 MED ORDER — GLUCERNA SHAKE PO LIQD
237.0000 mL | Freq: Two times a day (BID) | ORAL | Status: DC
  Administered 2017-04-21: 237 mL via ORAL

## 2017-04-20 MED ORDER — CHLORHEXIDINE GLUCONATE CLOTH 2 % EX PADS
6.0000 | MEDICATED_PAD | Freq: Every day | CUTANEOUS | Status: DC
  Administered 2017-04-20: 6 via TOPICAL

## 2017-04-20 NOTE — Progress Notes (Signed)
Initial Nutrition Assessment  DOCUMENTATION CODES:   Obesity unspecified  INTERVENTION:  Provide Glucerna Shake po BID, each supplement provides 220 kcal and 10 grams of protein.  Encourage adequate PO intake.   NUTRITION DIAGNOSIS:   Increased nutrient needs related to  (post op healing) as evidenced by estimated needs.  GOAL:   Patient will meet greater than or equal to 90% of their needs  MONITOR:   PO intake, Supplement acceptance, Labs, Weight trends, Skin, I & O's  REASON FOR ASSESSMENT:   Low Braden    ASSESSMENT:   83 y.o. female with medical history significant of DM, dementia [facility]. Found to have femur fx.  Procedure (7/6): INTRAMEDULLARY (IM) NAIL INTERTROCHANTRIC (Right)  Meal completion has been varied from 5-80%. Daughter at bedside reports meal completion dependent on foods she likes or dislikes. Daughter reports pt was eating well PTA with usual consumption of at least 3 meals a day with no other difficulties. Usual body weight unknown to daughter. RD to order nutritional supplements to aid in caloric and protein needs.   Nutrition-Focused physical exam completed. Findings are no fat depletion, moderate muscle depletion, and milk edema. Depletion likely related to muscle mass loss.  Labs and medications reviewed.   Diet Order:  Diet Carb Modified Fluid consistency: Thin; Room service appropriate? No  Skin:   (Incision on R hip)  Last BM:  7/5  Height:   Ht Readings from Last 1 Encounters:  04/16/17 5\' 3"  (1.6 m)    Weight:   Wt Readings from Last 1 Encounters:  04/16/17 173 lb (78.5 kg)    Ideal Body Weight:  52.27 kg  BMI:  Body mass index is 30.65 kg/m.  Estimated Nutritional Needs:   Kcal:  1500-1700  Protein:  70-80 grams  Fluid:  >/= 1.5 L/day  EDUCATION NEEDS:   No education needs identified at this time  Roslyn SmilingStephanie Josia Cueva, MS, RD, LDN Pager # [phone] After hours/ weekend  pager # [phone]

## 2017-04-20 NOTE — Social Work (Addendum)
CSW met with daughter Hoyle Sauer at bedside. She advised that patient winn need short term rehab. She gave CSW permission to send out SNF offers to [place] area, [place] close to her home. CSW completed FL2 and send out offers.  CSW called [facility] to discuss transition to short term rehab. CSW unable to make contact with admissions.  CSW called [facility] about placement. CSW spoke with admissions, who indicated she would review. She advised that she cannot take patient today.

## 2017-04-20 NOTE — Progress Notes (Signed)
Patient ID: Emily EaringMary Katherine Werner, female   DOB: 06/27/1917, 83 y.o.   MRN: [id number]    PROGRESS NOTE   Emily EaringMary Katherine Werner  WUJ:[id number] DOB: 01/18/1917 DOA: 04/16/2017  PCP: Patient, No Pcp Per   Brief Narrative:  Pt is 83 yo female with known advanced dementia, admitted after an episode of fall and was found to have sustained right femur fracture.   Assessment & Plan:   Principal Problem:   Fracture - right femur fracture - s/p IM nailing, post op day #3 - pt overall more alert but remains confused  - PT today and possible d/c SNF in 24-48 hours   Active Problems:   Alzheimer's dementia - advanced - remains confused - SNF placement in progress     Post op blood loss anemia - drop in Hg post op down from 8.3 --> 6.7 - pt was transfused 2 U PRBC 7/8 and Hg now up to 11.2 - no evidence of active bleeding - will repeat CBC in AM    Leukocytosis - suspect reactive - still up this AM, no fevers, no specific concerns  - no clear evidence of an infectious etiology  - check CBC in AM    DM type II - on glimepiride at home - keep on SSI for now   DVT prophylaxis: SCD's Code Status: DNR Family Communication: no family at bedside but they have my cell phone number to reach me for update  Disposition Plan: SNF in AM if WBC and Hg stable   Consultants:   Ortho   Procedures:   Right IM nail, intertrochanteric 7/6  Antimicrobials:   Pre op ABX only   Subjective: Pt more alert this AM but confused.   Objective: Vitals:   04/19/17 1216 04/19/17 1412 04/19/17 2041 04/20/17 0418  BP: (!) 111/52 (!) 99/52 (!) 117/45 97/80  Pulse: 83 72 82 82  Resp: 16 16    Temp: 97.6 F (36.4 C) (!) 97.5 F (36.4 C) 98.8 F (37.1 C) 98.6 F (37 C)  TempSrc: Axillary Axillary Oral Oral  SpO2: 91% 95% 95% 92%  Weight:      Height:        Intake/Output Summary (Last 24 hours) at 04/20/17 1010 Last data filed at 04/20/17 0844  Gross per 24 hour  Intake          1258.33 ml    Output                0 ml  Net          1258.33 ml   Filed Weights   04/16/17 2338  Weight: 78.5 kg (173 lb)    Physical Exam  Constitutional: Appears alert but confused, NAD CVS: RRR, S1/S2 +, no gallops, no carotid bruit.  Pulmonary: Effort and breath sounds normal, diminished breath sounds at bases  Abdominal: Soft. BS +,  no distension, tenderness, rebound or guarding.  Musculoskeletal: Normal range of motion. No edema  Data Reviewed: I have personally reviewed following labs and imaging studies  CBC:  Recent Labs Lab 04/17/17 0140 04/18/17 0510 04/18/17 1446 04/19/17 0404 04/20/17 0631  WBC 13.0* 14.4* 16.2* 10.8* 13.2*  NEUTROABS 8.6*  --   --   --   --   HGB 8.5* 7.6* 8.3* 6.7* 11.2*  HCT 26.5* 24.8* 26.9* 22.4* 34.1*  MCV 81.0 82.4 83.5 83.9 84.6  PLT 366 333 289 292 232   Basic Metabolic Panel:  Recent Labs Lab 04/17/17 0140 04/18/17 0510 04/19/17  0404 04/20/17 0631  NA 141 137 140 139  K 3.5 4.4 3.7 4.2  CL 106 106 109 110  CO2 23 22 26 23   GLUCOSE 69 201* 104* 101*  BUN 14 11 12 11   CREATININE 1.01* 0.96 0.89 0.73  CALCIUM 8.8* 7.9* 8.0* 8.0*   Liver Function Tests:  Recent Labs Lab 04/17/17 0140  AST 19  ALT 15  ALKPHOS 148*  BILITOT 0.6  PROT 6.6  ALBUMIN 3.4*   Coagulation Profile:  Recent Labs Lab 04/17/17 0637  INR 1.08   CBG:  Recent Labs Lab 04/19/17 1738 04/19/17 2039 04/20/17 0027 04/20/17 0422 04/20/17 0907  GLUCAP 160* 157* 134* 103* 117*   Urine analysis:    Component Value Date/Time   COLORURINE YELLOW 04/18/2017 1345   APPEARANCEUR HAZY (A) 04/18/2017 1345   LABSPEC 1.029 04/18/2017 1345   PHURINE 5.0 04/18/2017 1345   GLUCOSEU 150 (A) 04/18/2017 1345   HGBUR SMALL (A) 04/18/2017 1345   BILIRUBINUR NEGATIVE 04/18/2017 1345   KETONESUR 5 (A) 04/18/2017 1345   PROTEINUR 30 (A) 04/18/2017 1345   UROBILINOGEN 0.2 11/25/2011 1021   NITRITE NEGATIVE 04/18/2017 1345   LEUKOCYTESUR TRACE (A) 04/18/2017  1345   Recent Results (from the past 240 hour(s))  MRSA PCR Screening     Status: None   Collection Time: 04/17/17  5:35 AM  Result Value Ref Range Status   MRSA by PCR NEGATIVE NEGATIVE Final    Comment:        The GeneXpert MRSA Assay (FDA approved for NASAL specimens only), is one component of a comprehensive MRSA colonization surveillance program. It is not intended to diagnose MRSA infection nor to guide or monitor treatment for MRSA infections.   Surgical pcr screen     Status: Abnormal   Collection Time: 04/17/17  2:35 PM  Result Value Ref Range Status   MRSA, PCR NEGATIVE NEGATIVE Final   Staphylococcus aureus POSITIVE (A) NEGATIVE Final    Comment:        The Xpert SA Assay (FDA approved for NASAL specimens in patients over 71 years of age), is one component of a comprehensive surveillance program.  Test performance has been validated by [facility] for patients greater than or equal to 91 year old. It is not intended to diagnose infection nor to guide or monitor treatment.       Radiology Studies: No results found. Scheduled Meds: . docusate sodium  100 mg Oral BID  . insulin aspart  0-9 Units Subcutaneous Q4H  . mirtazapine  15 mg Oral QHS   Continuous Infusions: . sodium chloride 50 mL/hr at 04/19/17 1456  . sodium chloride    . lactated ringers    . methocarbamol (ROBAXIN)  IV       LOS: 3 days   Time spent: 25 minutes   Debbora Presto, MD [facility] Pager [phone]  If 7PM-7AM, please contact night-coverage [email or web address] Password [email or web address] 04/20/2017, 10:10 AM

## 2017-04-20 NOTE — Progress Notes (Signed)
Subjective: 3 Days Post-Op Procedure(s) (LRB): INTRAMEDULLARY (IM) NAIL INTERTROCHANTRIC (Right) Patient not reliable historian, but No new complaints. Family at bedside.  Pt having a good day so far, they are hopeful to get her standing up soon.  Objective: Vital signs in last 24 hours: Temp:  [97.5 F (36.4 C)-98.8 F (37.1 C)] 98.6 F (37 C) (07/09 0418) Pulse Rate:  [72-82] 82 (07/09 0418) Resp:  [16] 16 (07/08 1412) BP: (97-117)/(45-80) 97/80 (07/09 0418) SpO2:  [92 %-95 %] 92 % (07/09 0418)  Intake/Output from previous day: 07/08 0701 - 07/09 0700 In: 1138.3 [P.O.:600; I.V.:203.3; Blood:335] Out: -  Intake/Output this shift: Total I/O In: 240 [P.O.:240] Out: -    Recent Labs  04/18/17 0510 04/18/17 1446 04/19/17 0404 04/20/17 0631  HGB 7.6* 8.3* 6.7* 11.2*    Recent Labs  04/19/17 0404 04/20/17 0631  WBC 10.8* 13.2*  RBC 2.67* 4.03  HCT 22.4* 34.1*  PLT 292 232    Recent Labs  04/19/17 0404 04/20/17 0631  NA 140 139  K 3.7 4.2  CL 109 110  CO2 26 23  BUN 12 11  CREATININE 0.89 0.73  GLUCOSE 104* 101*  CALCIUM 8.0* 8.0*   No results for input(s): LABPT, INR in the last 72 hours.  Well nourished. Alert.  Abdomen soft and non tender. Right Calf soft and non tender. Right hip dressings C/D/I. No DVT signs. Compartment soft. No signs of infection.  Right LE grossly neurovascular intact.  Assessment/Plan: 3 Days Post-Op Procedure(s) (LRB): INTRAMEDULLARY (IM) NAIL INTERTROCHANTRIC (Right) PT  Possible SNf at d/c WBAT RLE hgb up following 2 units pRBCs yesterday, no signs of bleeding at incisions. Dry dressings to wounds over right hip Bid ASA and SCDs for DVT ppx   Emily Werner 04/20/2017, 12:42 PM

## 2017-04-20 NOTE — Progress Notes (Signed)
Physical Therapy Treatment Patient Details Name: Emily EaringMary Katherine Werner MRN: [id number] DOB: 11/02/1916 Today's Date: 04/20/2017    History of Present Illness Pt is a 83 yo female, found on ground after mechanical fall in her SNF, c/o R hip pain, s/p R hip IM nail 04/17/17. PMH for advanced Alzheimer's, DM, and UTI.    PT Comments    Upon arrival pt in recliner chair and was sliding out of chair. Nurse arrived and assisted PT to bring pt back up into chair before transferring back to bed. Pt moaning intermittently throughout session and resisted movement of R LE. Once in bed, pt was only able to tolerate a few LE exercises. Pt will continue to benefit from acute therapy.    Follow Up Recommendations  SNF;Supervision/Assistance - 24 hour     Equipment Recommendations       Recommendations for Other Services       Precautions / Restrictions Precautions Precautions: Fall Restrictions RLE Weight Bearing: Weight bearing as tolerated    Mobility  Bed Mobility Overal bed mobility: Needs Assistance Bed Mobility: Sit to Supine       Sit to supine: Mod assist   General bed mobility comments: assist to bring legs to srface with truncal guidance  Transfers Overall transfer level: Needs assistance   Transfers: Sit to/from Stand;Stand Pivot Transfers Sit to Stand: Mod assist         General transfer comment: mod assist to rise x2 trials, pivot with bil UE support on therapists, cues for sequence and safety  Ambulation/Gait             General Gait Details: unable   Stairs            Wheelchair Mobility    Modified Rankin (Stroke Patients Only)       Balance Overall balance assessment: Needs assistance   Sitting balance-Leahy Scale: Poor     Standing balance support: Bilateral upper extremity supported Standing balance-Leahy Scale: Zero                              Cognition Arousal/Alertness: Awake/alert Behavior During Therapy: Flat  affect Overall Cognitive Status: No family/caregiver present to determine baseline cognitive functioning                                        Exercises General Exercises - Lower Extremity Heel Slides: Right;Left;5 reps;Supine;AAROM (3 reps right, 5 reps left)    General Comments        Pertinent Vitals/Pain Pain Assessment:  (PAINAD=5) Pain Location: R hip with movement Pain Intervention(s): Repositioned;Limited activity within patient's tolerance;Premedicated before session    Home Living                      Prior Function            PT Goals (current goals can now be found in the care plan section) Progress towards PT goals: Progressing toward goals    Frequency    Min 2X/week      PT Plan Current plan remains appropriate    Co-evaluation              AM-PAC PT "6 Clicks" Daily Activity  Outcome Measure  Difficulty turning over in bed (including adjusting bedclothes, sheets and blankets)?: Total Difficulty moving from lying on back to  sitting on the side of the bed? : Total Difficulty sitting down on and standing up from a chair with arms (e.g., wheelchair, bedside commode, etc,.)?: Total Help needed moving to and from a bed to chair (including a wheelchair)?: Total Help needed walking in hospital room?: Total Help needed climbing 3-5 steps with a railing? : Total 6 Click Score: 6    End of Session Equipment Utilized During Treatment: Gait belt Activity Tolerance: Patient limited by pain Patient left: in bed;with call bell/phone within reach;with bed alarm set Nurse Communication: Mobility status;Weight bearing status PT Visit Diagnosis: Unsteadiness on feet (R26.81);Other abnormalities of gait and mobility (R26.89);History of falling (Z91.81);Muscle weakness (generalized) (M62.81);Difficulty in walking, not elsewhere classified (R26.2);Pain Pain - Right/Left: Right Pain - part of body: Hip     Time: 1610-9604 PT Time  Calculation (min) (ACUTE ONLY): 21 min  Charges:  $Therapeutic Activity: 8-22 mins                    G Codes:       Emily Werner, PT [phone]    Emily Werner 04/20/2017, 2:57 PM

## 2017-04-20 NOTE — NC FL2 (Signed)
[place] MEDICAID FL2 LEVEL OF CARE SCREENING TOOL     IDENTIFICATION  Patient Name: Emily Werner Birthdate: 12/13/1916 Sex: female Admission Date (Current Location): 04/16/2017  [facility] [place]   Facility and Address:         Provider Number: [id number]  Attending Physician Name and Address:  Dorothea OgleMyers, Iskra M, MD  Relative Name and Phone Number:  son, Ivin BootyMichael Webb,     Current Level of Care: Hospital Recommended Level of Care: Skilled Nursing Facility Prior Approval Number:    Date Approved/Denied: 04/18/17 PASRR Number: [id number]  Discharge Plan: SNF    Current Diagnoses: Patient Active Problem List   Diagnosis Date Noted  . Fracture 04/17/2017  . Alzheimer's dementia   . DM (diabetes mellitus) (HCC)   . Congenital prolapsed rectum     Orientation RESPIRATION BLADDER Height & Weight     Self  Normal Indwelling catheter, Incontinent Weight: 173 lb (78.5 kg) Height:  5\' 3"  (160 cm)  BEHAVIORAL SYMPTOMS/MOOD NEUROLOGICAL BOWEL NUTRITION STATUS      Continent Diet (See Dc Summary)  AMBULATORY STATUS COMMUNICATION OF NEEDS Skin   Extensive Assist Verbally Surgical wounds (Right Hip Closed Fracture with silicone dressing)                       Personal Care Assistance Level of Assistance  Bathing, Feeding, Dressing Bathing Assistance: Maximum assistance Feeding assistance: Maximum assistance Dressing Assistance: Maximum assistance     Functional Limitations Info             SPECIAL CARE FACTORS FREQUENCY  PT (By licensed PT), OT (By licensed OT)     PT Frequency: 2xweek OT Frequency: 2xweek            Contractures      Additional Factors Info  Code Status, Allergies Code Status Info: DNR Allergies Info: No Known Allergies           Current Medications (04/20/2017):  This is the current hospital active medication list Current Facility-Administered Medications  Medication Dose Route Frequency Provider  Last Rate Last Dose  . 0.9 %  sodium chloride infusion   Intravenous Continuous Dorothea OgleMyers, Iskra M, MD 50 mL/hr at 04/19/17 1456    . 0.9 %  sodium chloride infusion   Intravenous Once Eduard ClosKakrakandy, Arshad N, MD      . acetaminophen (TYLENOL) tablet 650 mg  650 mg Oral Q6H PRN Yolonda Kidaogers, Jason Patrick, MD   650 mg at 04/18/17 2115   Or  . acetaminophen (TYLENOL) suppository 650 mg  650 mg Rectal Q6H PRN Yolonda Kidaogers, Jason Patrick, MD      . Chlorhexidine Gluconate Cloth 2 % PADS 6 each  6 each Topical Daily Dorothea OgleMyers, Iskra M, MD      . docusate sodium (COLACE) capsule 100 mg  100 mg Oral BID Yolonda Kidaogers, Jason Patrick, MD   100 mg at 04/20/17 0933  . HYDROcodone-acetaminophen (NORCO/VICODIN) 5-325 MG per tablet 1-2 tablet  1-2 tablet Oral Q6H PRN Haydee Monicaavid, Rachal A, MD   1 tablet at 04/20/17 0449  . hydrocortisone (ANUSOL-HC) suppository 25 mg  25 mg Rectal BID PRN Dorothea OgleMyers, Iskra M, MD      . insulin aspart (novoLOG) injection 0-9 Units  0-9 Units Subcutaneous Q4H Haydee Monicaavid, Rachal A, MD   1 Units at 04/20/17 0116  . lactated ringers infusion   Intravenous Continuous Mal AmabileFoster, Michael, MD      . methocarbamol (ROBAXIN) tablet 500 mg  500 mg  Oral Q6H PRN Haydee Monica, MD       Or  . methocarbamol (ROBAXIN) 500 mg in dextrose 5 % 50 mL IVPB  500 mg Intravenous Q6H PRN Haydee Monica, MD      . mirtazapine (REMERON) tablet 15 mg  15 mg Oral QHS Dorothea Ogle, MD   15 mg at 04/19/17 2228  . morphine 4 MG/ML injection 0.52 mg  0.52 mg Intravenous Q2H PRN Haydee Monica, MD   0.52 mg at 04/17/17 1111  . mupirocin ointment (BACTROBAN) 2 % 1 application  1 application Nasal BID Dorothea Ogle, MD      . ondansetron [facility]) tablet 4 mg  4 mg Oral Q6H PRN Yolonda Kida, MD       Or  . ondansetron [facility]) injection 4 mg  4 mg Intravenous Q6H PRN Yolonda Kida, MD         Discharge Medications: Please see discharge summary for a list of discharge medications.  Relevant Imaging Results:  Relevant Lab  Results:   Additional Information SS: [id number]  Tresa Moore, LCSW

## 2017-04-21 LAB — BASIC METABOLIC PANEL
Anion gap: 9 (ref 5–15)
BUN: 11 mg/dL (ref 6–20)
CO2: 22 mmol/L (ref 22–32)
CREATININE: 0.63 mg/dL (ref 0.44–1.00)
Calcium: 7.9 mg/dL — ABNORMAL LOW (ref 8.9–10.3)
Chloride: 105 mmol/L (ref 101–111)
GFR calc Af Amer: >60 mL/min (ref >60)
GFR calc non Af Amer: >60 mL/min (ref >60)
GLUCOSE: 108 mg/dL — AB (ref 65–99)
Potassium: 4 mmol/L (ref 3.5–5.1)
SODIUM: 136 mmol/L (ref 135–145)

## 2017-04-21 LAB — GLUCOSE, CAPILLARY
GLUCOSE-CAPILLARY: 118 mg/dL — AB (ref 65–99)
Glucose-Capillary: 125 mg/dL — ABNORMAL HIGH (ref 65–99)

## 2017-04-21 LAB — CBC
HCT: 31.9 % — ABNORMAL LOW (ref 36.0–46.0)
Hemoglobin: 10.2 g/dL — ABNORMAL LOW (ref 12.0–15.0)
MCH: 27.1 pg (ref 26.0–34.0)
MCHC: 32 g/dL (ref 30.0–36.0)
MCV: 84.6 fL (ref 78.0–100.0)
Platelets: 287 10*3/uL (ref 150–400)
RBC: 3.77 MIL/uL — ABNORMAL LOW (ref 3.87–5.11)
RDW: 16.8 % — ABNORMAL HIGH (ref 11.5–15.5)
WBC: 11.7 10*3/uL — ABNORMAL HIGH (ref 4.0–10.5)

## 2017-04-21 MED ORDER — DOCUSATE SODIUM 100 MG PO CAPS: 100.0000 mg | ORAL_CAPSULE | Freq: Two times a day (BID) | ORAL | 0 refills | Status: AC

## 2017-04-21 MED ORDER — ASPIRIN EC 81 MG PO TBEC: 81.0000 mg | DELAYED_RELEASE_TABLET | Freq: Every day | ORAL | 0 refills | Status: DC

## 2017-04-21 MED ORDER — METHOCARBAMOL 500 MG PO TABS: 500.0000 mg | ORAL_TABLET | Freq: Four times a day (QID) | ORAL | 0 refills | Status: AC | PRN

## 2017-04-21 NOTE — Social Work (Addendum)
CSW spoke with daughter and son today. [facility].  Daughter: Lesle ChrisCarolyn Decao [phone].  CS[facility]. SNF is ready for patient any time. Signed DC summary must be received before 3pm for admission to facility today.  CSW will follow up with clinical staff for discharging plan.  Keene BreathPatricia Noa Constante, LCSW Clinical Social Worker [phone]

## 2017-04-21 NOTE — Discharge Summary (Signed)
Physician Discharge Summary  Emily Werner ZOX:[id number] DOB: 13-Aug-1917 DOA: 04/16/2017  PCP: Patient, No Pcp Per  Admit date: 04/16/2017 Discharge date: 04/21/2017  Recommendations for Outpatient Follow-up:  1. Pt will need to follow up with PCP in 1-2 weeks post discharge 2. Please obtain BMP to evaluate electrolytes and kidney function 3. Please also check CBC to evaluate Hg and Hct levels 4. Continue Aspirin 81 mg BID PO for 4 weeks 5. Follow up with ortho doctor, information below  Discharge Diagnoses:  Principal Problem:   Fracture Active Problems:   Alzheimer's dementia   DM (diabetes mellitus) (HCC)  Discharge Condition: Stable  Diet recommendation: Heart healthy diet discussed in details   Brief Narrative:  Pt is 83 yo female with known advanced dementia, admitted after an episode of fall and was found to have sustained right femur fracture.   Assessment & Plan:   Principal Problem:   Fracture - right femur fracture - s/p IM nailing, post op day #4 - pt overall more alert but remains confused  - plan d/c SNF  Active Problems:   Alzheimer's dementia - advanced - remains confused - SNF     Post op blood loss anemia - drop in Hg post op down from 8.3 --> 6.7 - pt was transfused 2 U PRBC 7/8  - Hg overall stable this am with no evidence of active bleeding     Leukocytosis - suspect reactive - trending down     DM type II - on glimepiride   DVT prophylaxis: Aspirin 81 mg PO BID for 4 weeks  Code Status: DNR Family Communication: son over the phone  Disposition Plan: SNF   Consultants:   Ortho   Procedures:   Right IM nail, intertrochanteric 7/6  Antimicrobials:   Pre op ABX only   Procedures/Studies: Dg Chest 1 View  Result Date: 04/17/2017 CLINICAL DATA:  Status post fall on right hip, with concern for chest injury. Initial encounter. EXAM: CHEST 1 VIEW COMPARISON:  Chest radiograph performed 03/29/2017 FINDINGS: The lungs  are well-aerated. There is elevation of the right hemidiaphragm, with right basilar atelectasis. There is no evidence of pleural effusion or pneumothorax. The cardiomediastinal silhouette is borderline normal in size. No acute osseous abnormalities are seen. IMPRESSION: Elevation of the right hemidiaphragm, with right basilar atelectasis. No displaced rib fractures identified. Electronically Signed   By: Roanna Raider M.D.   On: 04/17/2017 01:08   Dg C-arm 1-60 Min  Result Date: 04/17/2017 CLINICAL DATA:  83 year old female with a history of femur fracture EXAM: RIGHT FEMUR 2 VIEWS; DG C-ARM 61-120 MIN COMPARISON:  04/17/2017 FINDINGS: Intraoperative fluoroscopic spot images during open reduction internal fixation of right femoral neck fracture. Images demonstrate antegrade intramedullary rod with gamma nail fixation at the femoral head and distal interlocking screws. No immediate complicating features. IMPRESSION: Limited intraoperative fluoroscopic spot images of open reduction internal fixation of right femoral neck fracture. Please refer to the dictated operative report for full details of intraoperative findings and procedure. Electronically Signed   By: Gilmer Mor D.O.   On: 04/17/2017 18:35   Dg Hip Unilat W Or Wo Pelvis 2-3 Views Right  Result Date: 04/17/2017 CLINICAL DATA:  Status post fall on right hip, with right hip pain. Initial encounter. EXAM: DG HIP (WITH OR WITHOUT PELVIS) 2-3V RIGHT COMPARISON:  None. FINDINGS: There is a displaced right femoral intertrochanteric fracture. No additional fractures are seen. The right femoral head remains seated at the acetabulum. The left hip  joint is grossly unremarkable. There is mild chronic deformity of the left inferior pubic ramus. The sacroiliac joints demonstrate mild sclerosis. The visualized bowel gas pattern is grossly unremarkable. No definite soft tissue abnormalities are characterized on radiograph. IMPRESSION: Displaced right femoral  intertrochanteric fracture. Electronically Signed   By: Roanna Raider M.D.   On: 04/17/2017 01:07   Dg Femur, Min 2 Views Right  Result Date: 04/17/2017 CLINICAL DATA:  83 year old female with a history of femur fracture EXAM: RIGHT FEMUR 2 VIEWS; DG C-ARM 61-120 MIN COMPARISON:  04/17/2017 FINDINGS: Intraoperative fluoroscopic spot images during open reduction internal fixation of right femoral neck fracture. Images demonstrate antegrade intramedullary rod with gamma nail fixation at the femoral head and distal interlocking screws. No immediate complicating features. IMPRESSION: Limited intraoperative fluoroscopic spot images of open reduction internal fixation of right femoral neck fracture. Please refer to the dictated operative report for full details of intraoperative findings and procedure. Electronically Signed   By: Gilmer Mor D.O.   On: 04/17/2017 18:35   Dg Femur, Min 2 Views Right  Result Date: 04/17/2017 CLINICAL DATA:  Status post fall, with right hip pain. Initial encounter. EXAM: RIGHT FEMUR 2 VIEWS COMPARISON:  None. FINDINGS: There is a mildly displaced intertrochanteric fracture through the proximal right femur. No additional fractures are seen. The right femoral head remains seated at the acetabulum. The knee joint is grossly unremarkable. No knee joint effusion is identified. No definite soft tissue abnormalities are characterized on radiograph. IMPRESSION: Mildly displaced intertrochanteric fracture through the proximal right femur. Electronically Signed   By: Roanna Raider M.D.   On: 04/17/2017 01:05   Dg Femur Port, Min 2 Views Right  Result Date: 04/17/2017 CLINICAL DATA:  Postop intertrochanteric nail fixation of the right hip. EXAM: RIGHT FEMUR PORTABLE 2 VIEW COMPARISON:  None. FINDINGS: There is an intertrochanteric fracture of the right femur traversed by long-stem intramedullary nail fixation. No postoperative complications are noted. Postop soft tissue changes with  subcutaneous emphysema and skin staples are seen overlying the right hip. The femoral head is seated within the right hip joint. IMPRESSION: Status post IM nail fixation of a right intertrochanteric fracture of the right femur without immediate complication noted. Electronically Signed   By: Tollie Eth M.D.   On: 04/17/2017 19:34    Discharge Exam: Vitals:   04/20/17 2205 04/21/17 0420  BP: 138/65 (!) 148/55  Pulse: 75 78  Resp: 14 18  Temp: 98.5 F (36.9 C) 98.3 F (36.8 C)   Vitals:   04/20/17 0418 04/20/17 1450 04/20/17 2205 04/21/17 0420  BP: 97/80  138/65 (!) 148/55  Pulse: 82  75 78  Resp:   14 18  Temp: 98.6 F (37 C)  98.5 F (36.9 C) 98.3 F (36.8 C)  TempSrc: Oral  Oral Oral  SpO2: 92% 95% 95% 99%  Weight:      Height:       General: Pt is alert, confused, NAD Cardiovascular: Regular rate and rhythm, no rubs, no gallops Respiratory: Clear to auscultation bilaterally, no wheezing, no crackles, no rhonchi Abdominal: Soft, non tender, non distended, bowel sounds +, no guarding Extremities: no edema, no cyanosis  Discharge Instructions  Discharge Instructions    Diet - low sodium heart healthy    Complete by:  As directed    Increase activity slowly    Complete by:  As directed      Allergies as of 04/21/2017   No Known Allergies     Medication List  STOP taking these medications   aspirin 325 MG tablet Replaced by:  aspirin EC 81 MG tablet     TAKE these medications   acetaminophen 500 MG tablet Commonly known as:  TYLENOL Take 500 mg by mouth daily.   aspirin EC 81 MG tablet Take 1 tablet (81 mg total) by mouth 2 (two) times daily. Replaces:  aspirin 325 MG tablet   CALCIUM 600 PO Take 600 mg by mouth 2 (two) times daily.   diphenhydrAMINE 25 mg capsule Commonly known as:  BENADRYL Take 25 mg by mouth every 4 (four) hours as needed (for allergic reaction).   docusate sodium 100 MG capsule Commonly known as:  COLACE Take 1 capsule (100  mg total) by mouth 2 (two) times daily.   donepezil 10 MG tablet Commonly known as:  ARICEPT Take 10 mg by mouth daily.   glimepiride 4 MG tablet Commonly known as:  AMARYL Take 4 mg by mouth daily before breakfast.   levothyroxine 88 MCG tablet Commonly known as:  SYNTHROID, LEVOTHROID Take 88 mcg by mouth daily.   methocarbamol 500 MG tablet Commonly known as:  ROBAXIN Take 1 tablet (500 mg total) by mouth every 6 (six) hours as needed for muscle spasms.   mirtazapine 15 MG tablet Commonly known as:  REMERON Take 15 mg by mouth at bedtime.   pantoprazole 40 MG tablet Commonly known as:  PROTONIX Take 40 mg by mouth daily.   polyethylene glycol packet Commonly known as:  MIRALAX / GLYCOLAX Take 17 g by mouth daily.   shark liver oil-cocoa butter 0.25-3-85.5 % suppository Commonly known as:  PREPARATION H Place 1 suppository rectally 4 (four) times daily as needed for hemorrhoids.   vitamin B-12 500 MCG tablet Commonly known as:  CYANOCOBALAMIN Take 500 mcg by mouth daily.        Contact information for follow-up providers    Yolonda Kidaogers, Jason Patrick, MD. Schedule an appointment as soon as possible for a visit in 2 week(s).   Specialty:  Orthopedic Surgery Why:  For wound re-check Contact information: [address] [place] [address] [phone]            Contact information for after-discharge care    Destination    [facility] Follow up.   Specialty:  Skilled Nursing Facility Contact information: [address] [place] [id number] [phone]                   The results of significant diagnostics from this hospitalization (including imaging, microbiology, ancillary and laboratory) are listed below for reference.     Microbiology: Recent Results (from the past 240 hour(s))  MRSA PCR Screening     Status: None   Collection Time: 04/17/17  5:35 AM  Result Value Ref Range Status   MRSA by  PCR NEGATIVE NEGATIVE Final    Comment:        The GeneXpert MRSA Assay (FDA approved for NASAL specimens only), is one component of a comprehensive MRSA colonization surveillance program. It is not intended to diagnose MRSA infection nor to guide or monitor treatment for MRSA infections.   Surgical pcr screen     Status: Abnormal   Collection Time: 04/17/17  2:35 PM  Result Value Ref Range Status   MRSA, PCR NEGATIVE NEGATIVE Final   Staphylococcus aureus POSITIVE (A) NEGATIVE Final    Comment:        The Xpert SA Assay (FDA approved for  NASAL specimens in patients over 41 years of age), is one component of a comprehensive surveillance program.  Test performance has been validated by [facility] for patients greater than or equal to 84 year old. It is not intended to diagnose infection nor to guide or monitor treatment.      Labs: Basic Metabolic Panel:  Recent Labs Lab 04/17/17 0140 04/18/17 0510 04/19/17 0404 04/20/17 0631 04/21/17 0339  NA 141 137 140 139 136  K 3.5 4.4 3.7 4.2 4.0  CL 106 106 109 110 105  CO2 23 22 26 23 22   GLUCOSE 69 201* 104* 101* 108*  BUN 14 11 12 11 11   CREATININE 1.01* 0.96 0.89 0.73 0.63  CALCIUM 8.8* 7.9* 8.0* 8.0* 7.9*   Liver Function Tests:  Recent Labs Lab 04/17/17 0140  AST 19  ALT 15  ALKPHOS 148*  BILITOT 0.6  PROT 6.6  ALBUMIN 3.4*   CBC:  Recent Labs Lab 04/17/17 0140 04/18/17 0510 04/18/17 1446 04/19/17 0404 04/20/17 0631 04/21/17 0339  WBC 13.0* 14.4* 16.2* 10.8* 13.2* 11.7*  NEUTROABS 8.6*  --   --   --   --   --   HGB 8.5* 7.6* 8.3* 6.7* 11.2* 10.2*  HCT 26.5* 24.8* 26.9* 22.4* 34.1* 31.9*  MCV 81.0 82.4 83.5 83.9 84.6 84.6  PLT 366 333 289 292 232 287   CBG:  Recent Labs Lab 04/20/17 1631 04/20/17 1959 04/20/17 2340 04/21/17 0414 04/21/17 0835  GLUCAP 178* 210* 133* 125* 118*   SIGNED: Time coordinating discharge: 50 minutes  Debbora Presto, MD  [facility] 04/21/2017, 10:11 AM Pager [phone]  If 7PM-7AM, please contact night-coverage [email or web address] Password [email or web address]

## 2017-04-21 NOTE — Discharge Instructions (Signed)
-  Okay for full weightbearing as tolerated to the right leg -For prevention of blood clots take one 81 mg aspirin twice daily for 4 weeks. - Leave postoperative bandage in place for 1 week and remove and replace with a clean dry dressing once daily. Do not submerge wound under water until seen by surgeon in the office. -For pain take Tylenol and/or ibuprofen as needed.

## 2017-04-21 NOTE — Clinical Social Work Placement (Signed)
   CLINICAL SOCIAL WORK PLACEMENT  NOTE  Date:  04/21/2017  Patient Details  Name: Emily EaringMary Katherine Queener MRN: [id number] Date of Birth: 12/13/1916  Clinical Social Work is seeking post-discharge placement for this patient at the Skilled  Nursing Facility level of care (*CSW will initial, date and re-position this form in  chart as items are completed):  Yes   Patient/family provided with [facility] Clinical Social Work Department's list of facilities offering this level of care within the geographic area requested by the patient (or if unable, by the patient's family).  Yes   Patient/family informed of their freedom to choose among providers that offer the needed level of care, that participate in Medicare, Medicaid or managed care program needed by the patient, have an available bed and are willing to accept the patient.  Yes   Patient/family informed of [facility]'s ownership interest [facility] and [facility], as well as of the fact that they are under no obligation to receive care at these facilities.  PASRR submitted to EDS on       PASRR number received on 04/19/17     Existing PASRR number confirmed on 04/19/17     FL2 transmitted to all facilities in geographic area requested by pt/family on 04/19/17     FL2 transmitted to all facilities within larger geographic area on 04/19/17     Patient informed that his/her managed care company has contracts with or will negotiate with certain facilities, including the following:        Yes   Patient/family informed of bed offers received.  [facility]     Physician recommends and patient chooses bed at      [facility] to [facility] on 04/21/17.  Patient to be transferred to facility by [facility]     Patient family notified on 04/21/17 of transfer.  Name of family member notified:  daughter, Eber JonesCarolyn     PHYSICIAN Please prepare priority discharge summary,  including medications, Please prepare prescriptions, Please sign FL2, Please sign DNR     Additional Comment:    _______________________________________________ Tresa MoorePatricia V Kimiya Brunelle, LCSW 04/21/2017, 10:36 AM

## 2017-04-21 NOTE — Social Work (Signed)
Clinical Social Worker facilitated patient discharge including contacting patient family and facility to confirm patient discharge plans.  Clinical information faxed to facility and family agreeable with plan.    CSW arranged ambulance transport via [facility] to [facility].    RN to call [phone] to give report prior to discharge.  Clinical Social Worker will sign off for now as social work intervention is no longer needed. Please consult us again if new need arises.  Keene BreathPatricia Benjamim Harnish, LCSW Clinical Social Worker [phone]

## 2017-04-21 NOTE — Care Management Important Message (Signed)
Important Message  Patient Details  Name: Emily EaringMary Katherine Werner MRN: [id number] Date of Birth: 02/13/1917   Medicare Important Message Given:  Yes    Karilyn Wind 04/21/2017, 1:33 PM

## 2017-12-26 IMAGING — DX DG CHEST 1V
1 series · 1 of 1 positions shown · non-contrast
Comparison: Chest radiograph performed 03/29/2017

CLINICAL DATA: Status post fall on right hip, with concern for
chest injury. Initial encounter.

EXAM:
CHEST 1 VIEW

[chest ap]
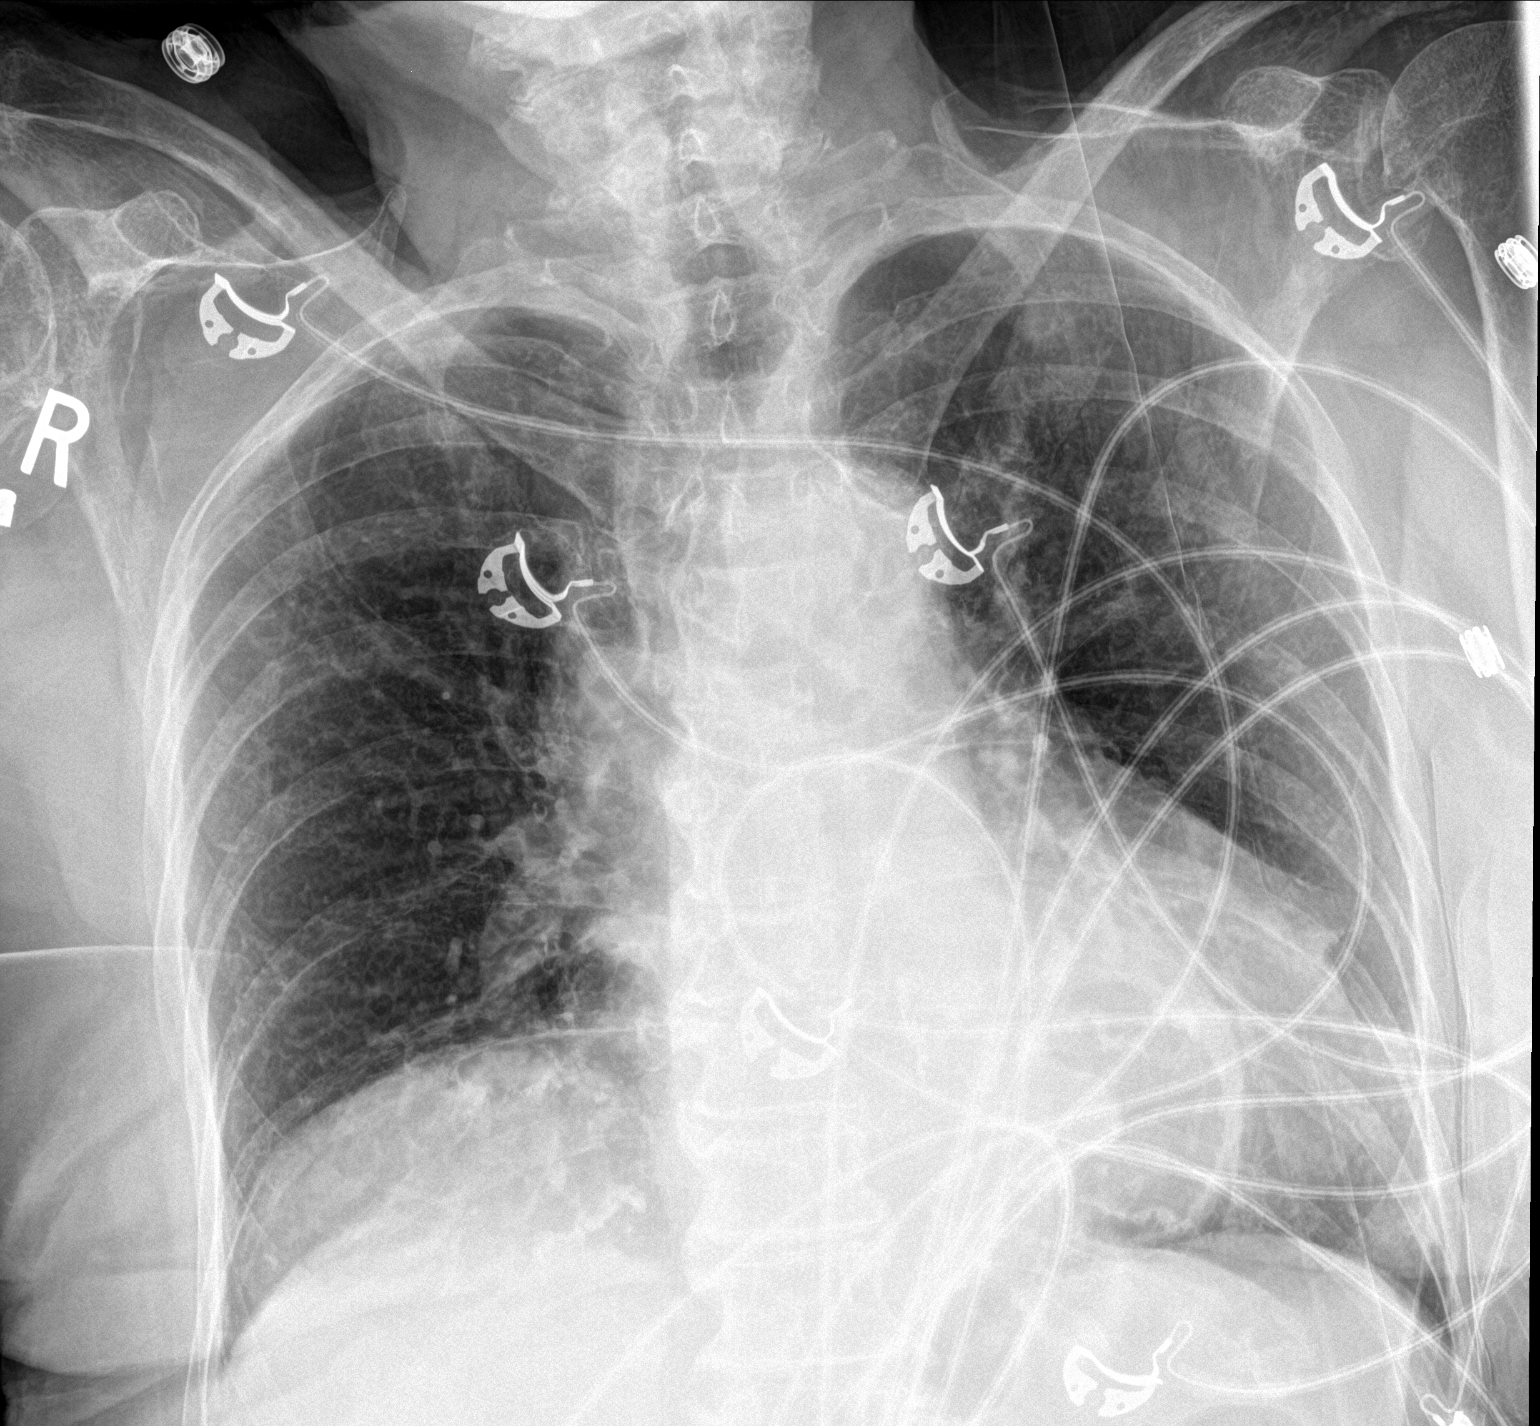

[1 of 1 positions shown; findings below may reference images not displayed]

FINDINGS: The lungs are well-aerated. There is elevation of the right
hemidiaphragm, with right basilar atelectasis. There is no evidence
of pleural effusion or pneumothorax.

The cardiomediastinal silhouette is borderline normal in size. No
acute osseous abnormalities are seen.
IMPRESSION: Elevation of the right hemidiaphragm, with right basilar
atelectasis. No displaced rib fractures identified.

## 2017-12-26 IMAGING — DX DG FEMUR 2+V PORT*R*
4 series · 4 of 4 positions shown · non-contrast
Comparison: None.

CLINICAL DATA: Postop intertrochanteric nail fixation of the right
hip.

EXAM:
RIGHT FEMUR PORTABLE 2 VIEW

[femur ap (1 of 2)]
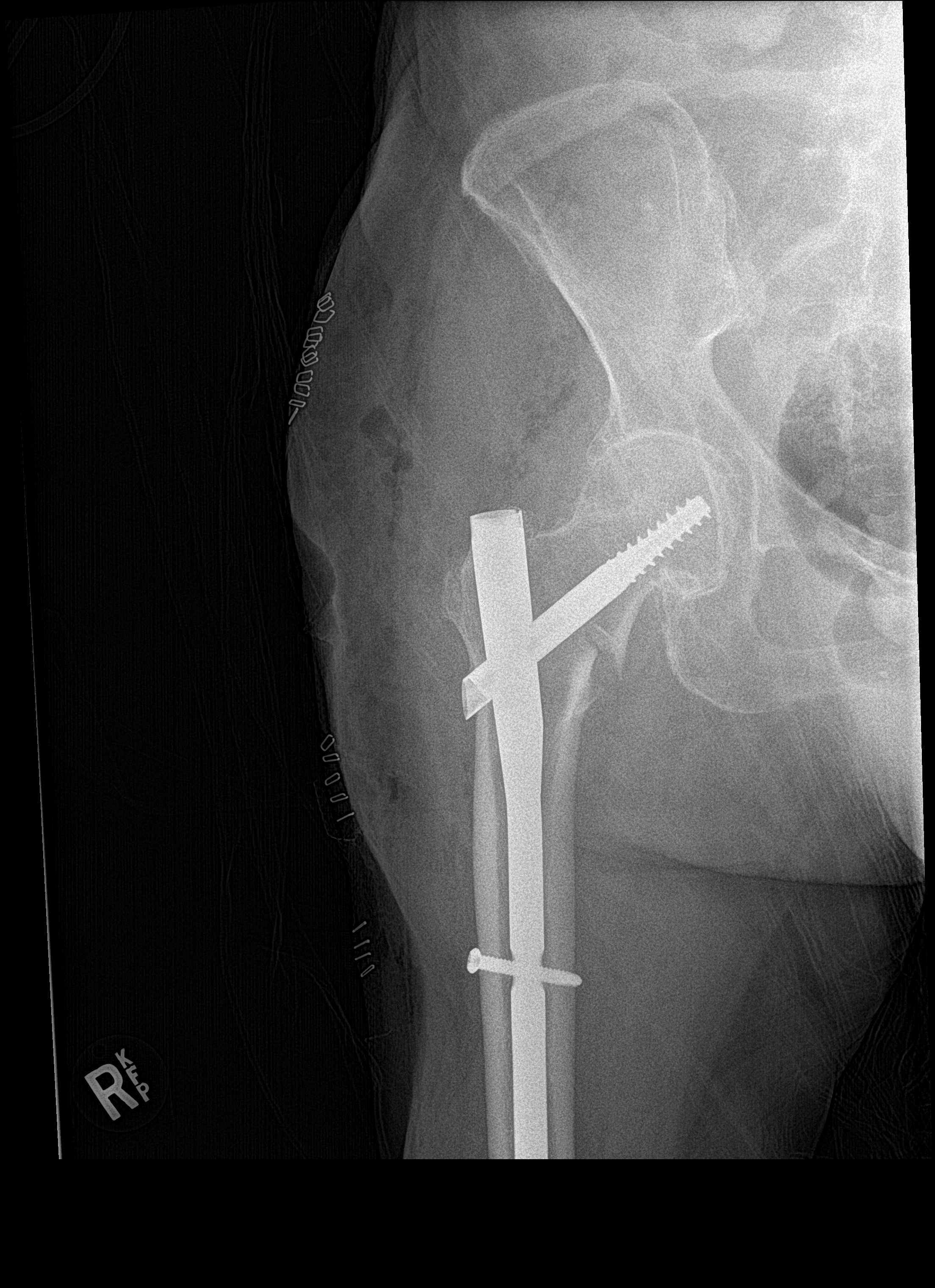

[femur ap (2 of 2)]
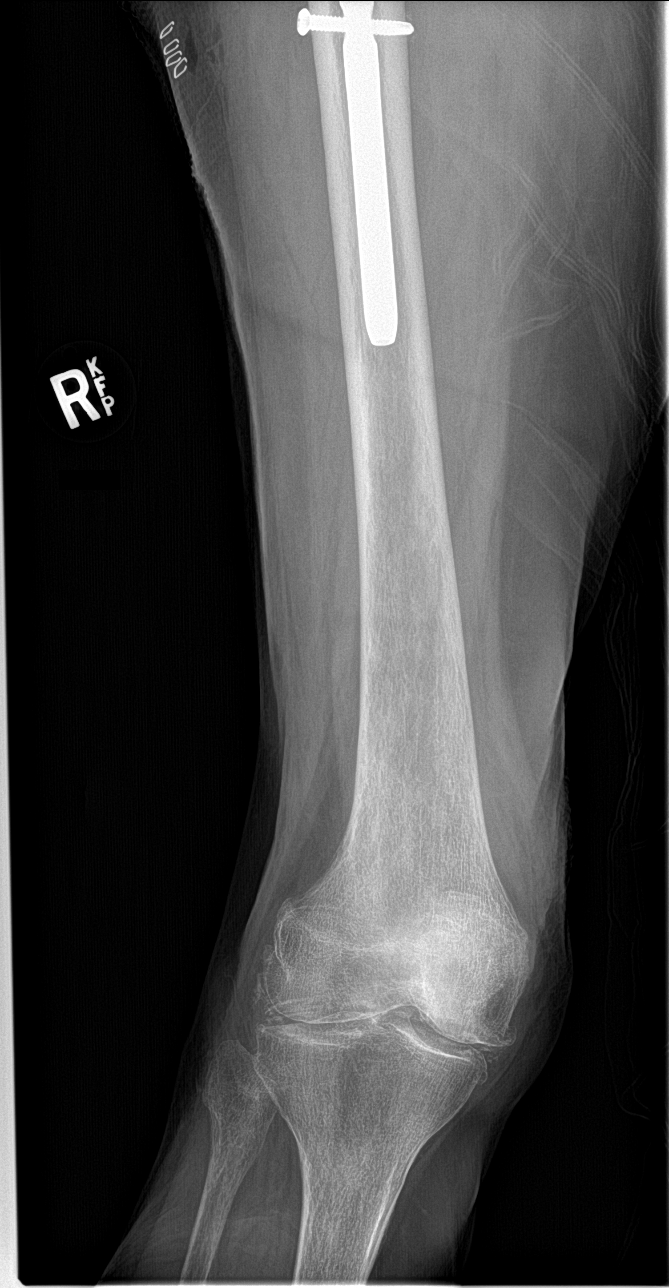

[femur lat (1 of 2)]
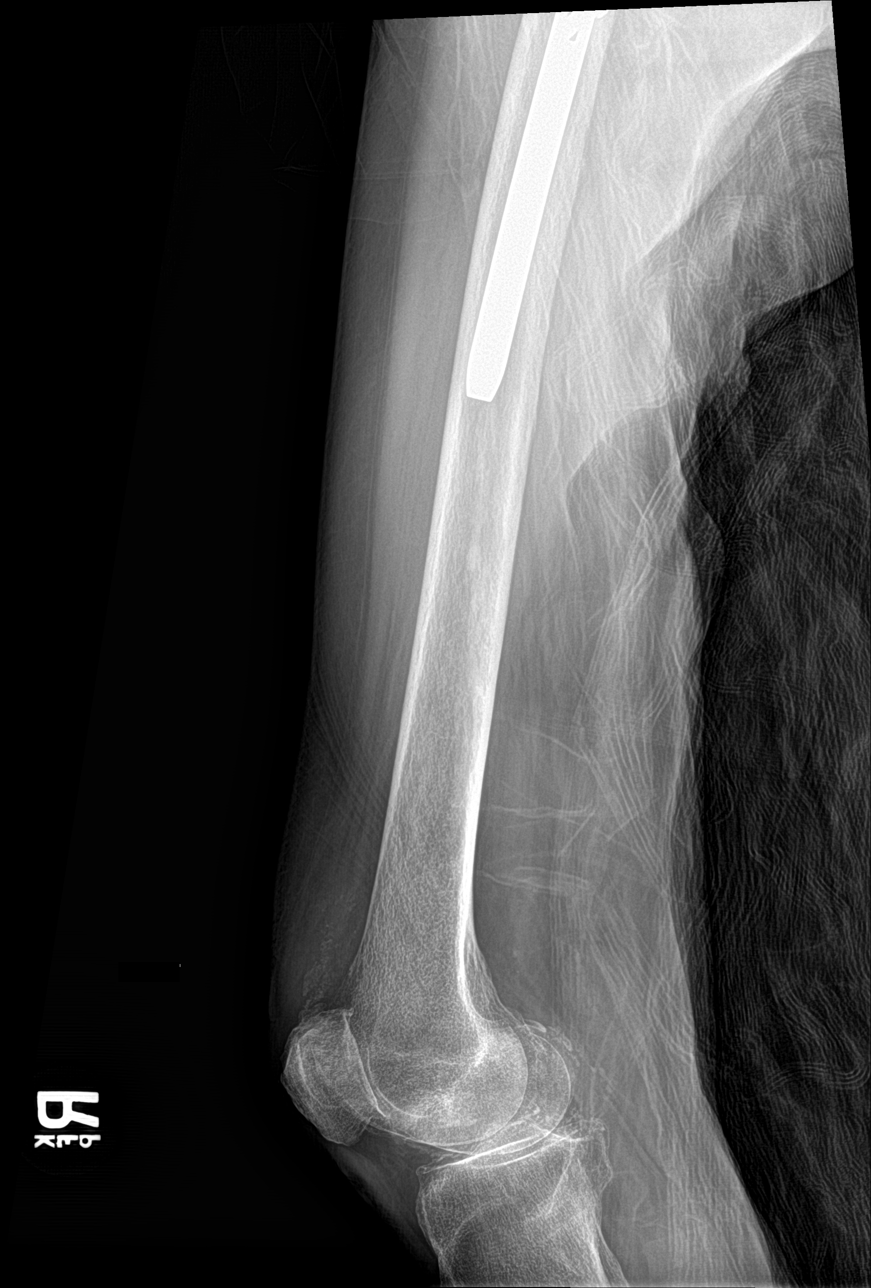

[femur lat (2 of 2)]
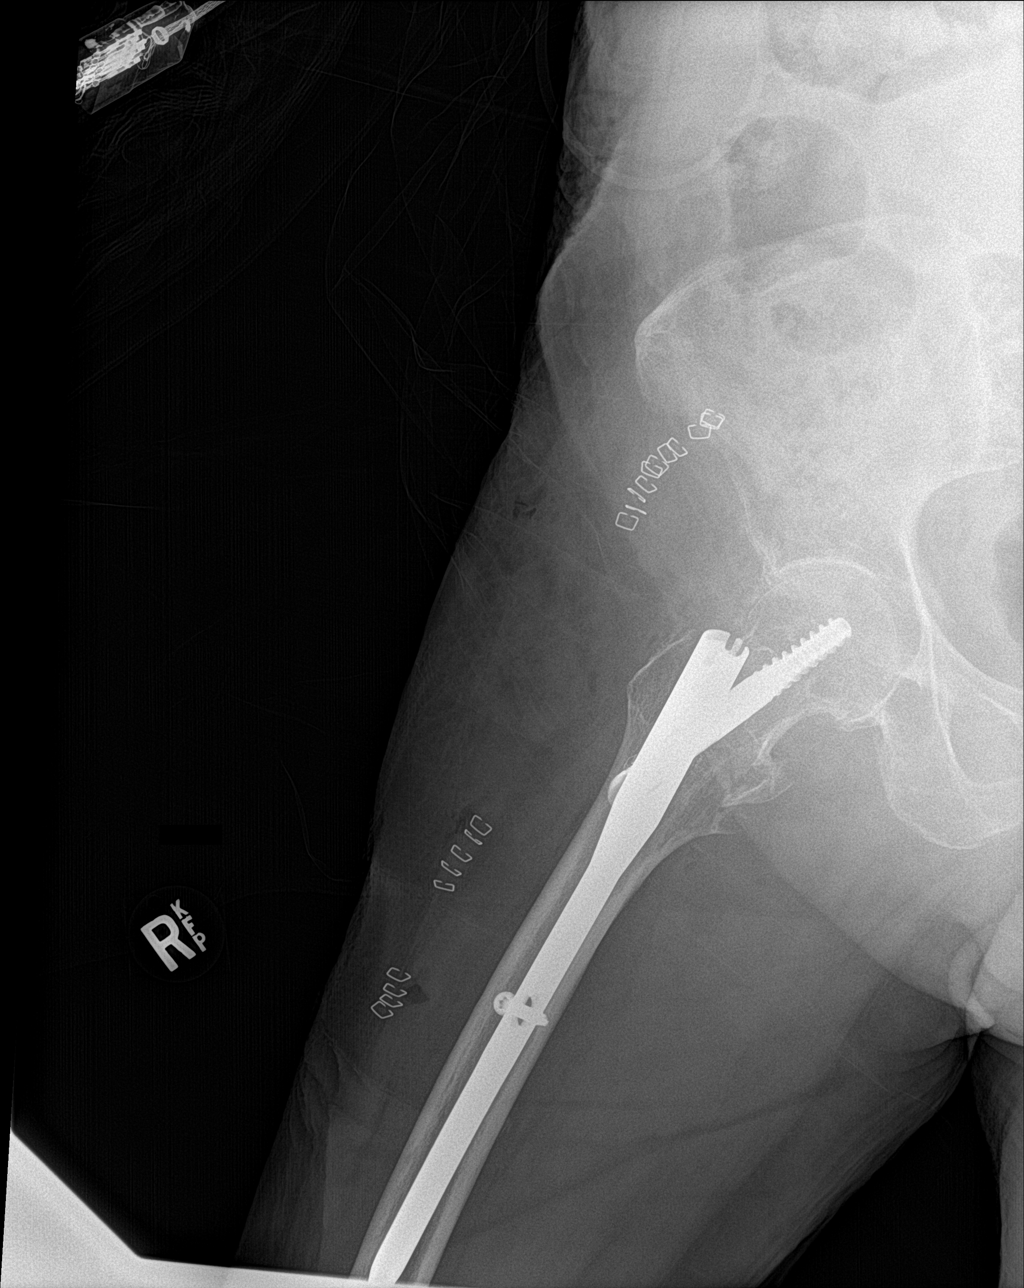

[4 of 4 positions shown; findings below may reference images not displayed]

FINDINGS: There is an intertrochanteric fracture of the right femur traversed
by long-stem intramedullary nail fixation. No postoperative
complications are noted. Postop soft tissue changes with
subcutaneous emphysema and skin staples are seen overlying the right
hip. The femoral head is seated within the right hip joint.
IMPRESSION: Status post IM nail fixation of a right intertrochanteric fracture
of the right femur without immediate complication noted.

## 2018-10-07 ENCOUNTER — Ambulatory Visit (INDEPENDENT_AMBULATORY_CARE_PROVIDER_SITE_OTHER): Payer: Medicare Other | Admitting: Otolaryngology

## 2018-10-07 DIAGNOSIS — H903 Sensorineural hearing loss, bilateral: Secondary | ICD-10-CM

## 2018-10-07 DIAGNOSIS — H6123 Impacted cerumen, bilateral: Secondary | ICD-10-CM | POA: Diagnosis not present

## 2018-10-07 DIAGNOSIS — H7201 Central perforation of tympanic membrane, right ear: Secondary | ICD-10-CM

## 2018-10-28 ENCOUNTER — Ambulatory Visit (INDEPENDENT_AMBULATORY_CARE_PROVIDER_SITE_OTHER): Payer: Medicare Other | Admitting: Otolaryngology

## 2018-10-28 DIAGNOSIS — H7201 Central perforation of tympanic membrane, right ear: Secondary | ICD-10-CM

## 2019-05-02 ENCOUNTER — Ambulatory Visit (INDEPENDENT_AMBULATORY_CARE_PROVIDER_SITE_OTHER): Payer: Medicare Other | Admitting: Otolaryngology

## 2019-05-02 ENCOUNTER — Other Ambulatory Visit: Payer: Self-pay

## 2019-05-02 DIAGNOSIS — H6122 Impacted cerumen, left ear: Secondary | ICD-10-CM | POA: Diagnosis not present

## 2019-05-02 DIAGNOSIS — H66011 Acute suppurative otitis media with spontaneous rupture of ear drum, right ear: Secondary | ICD-10-CM | POA: Diagnosis not present

## 2019-10-14 DEATH — deceased
